# Patient Record
Sex: Male | Born: 1945 | Race: Black or African American | Hispanic: No | Marital: Married | Smoking: Former smoker
Health system: Southern US, Community
[De-identification: ages and names within clinical notes are randomized; demographics above are authoritative.]

## PROBLEM LIST (undated history)

## (undated) DIAGNOSIS — I10 Essential (primary) hypertension: Secondary | ICD-10-CM

## (undated) DIAGNOSIS — E0590 Thyrotoxicosis, unspecified without thyrotoxic crisis or storm: Secondary | ICD-10-CM

## (undated) DIAGNOSIS — E039 Hypothyroidism, unspecified: Secondary | ICD-10-CM

## (undated) HISTORY — DX: Hypothyroidism, unspecified: E03.9

## (undated) HISTORY — DX: Thyrotoxicosis, unspecified without thyrotoxic crisis or storm: E05.90

---

## 2011-06-27 ENCOUNTER — Other Ambulatory Visit (HOSPITAL_COMMUNITY): Payer: Self-pay | Admitting: "Endocrinology

## 2011-06-27 DIAGNOSIS — E0590 Thyrotoxicosis, unspecified without thyrotoxic crisis or storm: Secondary | ICD-10-CM

## 2011-07-01 ENCOUNTER — Encounter (HOSPITAL_COMMUNITY): Payer: Self-pay

## 2011-07-01 ENCOUNTER — Encounter (HOSPITAL_COMMUNITY)
Admission: RE | Admit: 2011-07-01 | Discharge: 2011-07-01 | Disposition: A | Discharge status: Home / Self Care | Payer: BC Managed Care – PPO | Source: Ambulatory Visit | Attending: "Endocrinology | Admitting: "Endocrinology

## 2011-07-01 DIAGNOSIS — E0590 Thyrotoxicosis, unspecified without thyrotoxic crisis or storm: Secondary | ICD-10-CM | POA: Insufficient documentation

## 2011-07-01 DIAGNOSIS — R634 Abnormal weight loss: Secondary | ICD-10-CM | POA: Insufficient documentation

## 2011-07-01 HISTORY — DX: Essential (primary) hypertension: I10

## 2011-07-01 MED ORDER — SODIUM IODIDE I 131 CAPSULE
9.0000 | Freq: Once | INTRAVENOUS | Status: AC | PRN
  Administered 2011-07-01: 9 via ORAL

## 2011-07-02 ENCOUNTER — Encounter (HOSPITAL_COMMUNITY)
Admission: RE | Admit: 2011-07-02 | Discharge: 2011-07-02 | Disposition: A | Discharge status: Home / Self Care | Payer: BC Managed Care – PPO | Source: Ambulatory Visit | Attending: "Endocrinology | Admitting: "Endocrinology

## 2011-07-02 MED ORDER — SODIUM PERTECHNETATE TC 99M INJECTION
10.0000 | Freq: Once | INTRAVENOUS | Status: AC | PRN
  Administered 2011-07-02: 11 via INTRAVENOUS

## 2011-07-04 ENCOUNTER — Other Ambulatory Visit (HOSPITAL_COMMUNITY): Payer: Self-pay | Admitting: "Endocrinology

## 2011-07-04 DIAGNOSIS — E0500 Thyrotoxicosis with diffuse goiter without thyrotoxic crisis or storm: Secondary | ICD-10-CM

## 2011-07-05 ENCOUNTER — Encounter (HOSPITAL_COMMUNITY)
Admission: RE | Admit: 2011-07-05 | Discharge: 2011-07-05 | Disposition: A | Discharge status: Home / Self Care | Payer: BC Managed Care – PPO | Source: Ambulatory Visit | Attending: "Endocrinology | Admitting: "Endocrinology

## 2011-07-05 DIAGNOSIS — E0500 Thyrotoxicosis with diffuse goiter without thyrotoxic crisis or storm: Secondary | ICD-10-CM

## 2011-07-05 DIAGNOSIS — E0590 Thyrotoxicosis, unspecified without thyrotoxic crisis or storm: Secondary | ICD-10-CM | POA: Insufficient documentation

## 2011-07-05 IMAGING — NM NM RAI THERAPY FOR HYPERTHYROIDISM
1 series · 1 of 1 positions shown · non-contrast
Comparison: none

CLINICAL DATA: Hyperthyroidism

RADIOACTIVE IODINE THERAPY FOR HYPERTHYROIDISM:
TECHNIQUE: The risks and benefits of radioactive iodine therapy
were discussed with the patient in detail.  Alternative therapies
were also mentioned.  Radiation safety was discussed with the
patient, including how to protect the general public from exposure.
There were no barriers to communication.  Written consent was
obtained.  The patient then received a capsule containing the
radiopharmaceutical.
The patient will follow-up with the referring physician.
Radiopharmaceutical:  12 mCi [3O] sodium iodide.

[bone scan · 2.33mm/px · 1 of 1 slices shown]
[im 1/1]
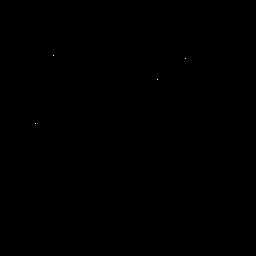

[1 of 1 positions shown; findings below may reference images not displayed]

IMPRESSION: Per oral administration of [3O] sodium iodide for the treatment of
hyperthyroidism.

## 2011-07-05 MED ORDER — SODIUM IODIDE I 131 CAPSULE
12.0000 | Freq: Once | INTRAVENOUS | Status: AC | PRN
  Administered 2011-07-05: 12 via ORAL

## 2014-12-13 ENCOUNTER — Other Ambulatory Visit: Payer: Self-pay | Admitting: "Endocrinology

## 2014-12-19 ENCOUNTER — Other Ambulatory Visit: Payer: Self-pay | Admitting: "Endocrinology

## 2014-12-19 DIAGNOSIS — E1169 Type 2 diabetes mellitus with other specified complication: Secondary | ICD-10-CM

## 2014-12-19 DIAGNOSIS — E039 Hypothyroidism, unspecified: Secondary | ICD-10-CM

## 2014-12-19 DIAGNOSIS — IMO0002 Reserved for concepts with insufficient information to code with codable children: Secondary | ICD-10-CM

## 2014-12-19 DIAGNOSIS — E1165 Type 2 diabetes mellitus with hyperglycemia: Secondary | ICD-10-CM

## 2014-12-19 LAB — HEMOGLOBIN A1C: Hemoglobin A1C: 8.1

## 2015-01-11 ENCOUNTER — Ambulatory Visit: Payer: BLUE CROSS/BLUE SHIELD | Admitting: "Endocrinology

## 2015-01-11 ENCOUNTER — Telehealth: Payer: Self-pay

## 2015-01-11 ENCOUNTER — Other Ambulatory Visit: Payer: Self-pay | Admitting: "Endocrinology

## 2015-01-11 LAB — TSH: TSH: 2.113 u[IU]/mL (ref 0.350–4.500)

## 2015-01-11 LAB — COMPREHENSIVE METABOLIC PANEL
ALT: 15 U/L (ref 9–46)
AST: 16 U/L (ref 10–35)
Albumin: 4.7 g/dL (ref 3.6–5.1)
Alkaline Phosphatase: 53 U/L (ref 40–115)
BUN: 13 mg/dL (ref 7–25)
CO2: 26 mmol/L (ref 20–31)
Calcium: 9.9 mg/dL (ref 8.6–10.3)
Chloride: 98 mmol/L (ref 98–110)
Creat: 0.81 mg/dL (ref 0.70–1.25)
Glucose, Bld: 128 mg/dL — ABNORMAL HIGH (ref 65–99)
Potassium: 4 mmol/L (ref 3.5–5.3)
Sodium: 134 mmol/L — ABNORMAL LOW (ref 135–146)
Total Bilirubin: 0.7 mg/dL (ref 0.2–1.2)
Total Protein: 7.9 g/dL (ref 6.1–8.1)

## 2015-01-11 LAB — T4, FREE: Free T4: 0.99 ng/dL (ref 0.80–1.80)

## 2015-01-11 MED ORDER — LEVOTHYROXINE SODIUM 88 MCG PO TABS: 88.0000 ug | ORAL_TABLET | Freq: Every day | ORAL | Status: DC

## 2015-01-11 NOTE — Telephone Encounter (Signed)
I sent a rx for Levothyroxine for 30 days , need to see his repeat labs to get more refills.

## 2015-01-11 NOTE — Telephone Encounter (Signed)
Pt is doing the rest of his labs today and coming back next week. He is requesting a refill on his Levothyroxine .

## 2015-01-11 NOTE — Telephone Encounter (Signed)
Pt.notified

## 2015-01-20 ENCOUNTER — Encounter: Payer: Self-pay | Admitting: "Endocrinology

## 2015-01-20 ENCOUNTER — Ambulatory Visit (INDEPENDENT_AMBULATORY_CARE_PROVIDER_SITE_OTHER): Payer: BLUE CROSS/BLUE SHIELD | Admitting: "Endocrinology

## 2015-01-20 VITALS — BP 141/82 | HR 63 | Ht 71.0 in | Wt 221.0 lb

## 2015-01-20 DIAGNOSIS — E119 Type 2 diabetes mellitus without complications: Secondary | ICD-10-CM | POA: Diagnosis not present

## 2015-01-20 DIAGNOSIS — E032 Hypothyroidism due to medicaments and other exogenous substances: Secondary | ICD-10-CM

## 2015-01-20 DIAGNOSIS — E890 Postprocedural hypothyroidism: Secondary | ICD-10-CM | POA: Insufficient documentation

## 2015-01-20 MED ORDER — LEVOTHYROXINE SODIUM 100 MCG PO TABS: 100.0000 ug | ORAL_TABLET | Freq: Every day | ORAL | Status: DC

## 2015-01-20 NOTE — Progress Notes (Signed)
Subjective:    Patient ID: Jacob Lowe, male    DOB: March 20, 1945, PCP Alinda DeemJANNACH,STEPHEN, MD   Past Medical History  Diagnosis Date  . Hypertension   . Diabetes mellitus   . Hypothyroidism   . Hyperthyroidism   . Hypertension    History reviewed. No pertinent past surgical history. Social History   Social History  . Marital Status: Married    Spouse Name: N/A  . Number of Children: N/A  . Years of Education: N/A   Social History Main Topics  . Smoking status: Former Games developermoker  . Smokeless tobacco: None  . Alcohol Use: No  . Drug Use: No  . Sexual Activity: Not Asked   Other Topics Concern  . None   Social History Narrative   Outpatient Encounter Prescriptions as of 01/20/2015  Medication Sig  . metFORMIN (GLUCOPHAGE) 500 MG tablet Take 500 mg by mouth 2 (two) times daily with a meal.  . metoprolol (LOPRESSOR) 50 MG tablet Take 50 mg by mouth 2 (two) times daily.  . verapamil (CALAN-SR) 180 MG CR tablet Take 180 mg by mouth at bedtime.  Marland Kitchen. levothyroxine (SYNTHROID, LEVOTHROID) 100 MCG tablet Take 1 tablet (100 mcg total) by mouth daily before breakfast.  . [DISCONTINUED] levothyroxine (SYNTHROID, LEVOTHROID) 88 MCG tablet Take 1 tablet (88 mcg total) by mouth daily before breakfast.   No facility-administered encounter medications on file as of 01/20/2015.   ALLERGIES: No Known Allergies VACCINATION STATUS:  There is no immunization history on file for this patient.  HPI 70 yr old Pt with GD s/p RAI ablation on 07/05/11.  He missed his appointment for more than a year. He returns for follow-up with labs. He reports that he had enough medications, did not interrupt his thyroid hormone. he feels much better, symptoms of heat intolerance and palpitations have resolved.  He has gained 6 pounds, he has steady energy level , sleeps better. he has  type 2 DM with a1c of  8.1% increasing from 6.4% last year, on metformin 500 mg by mouth twice a day. He is following with his  primary medical doctor.   Review of Systems  Constitutional: + weight gain, no fatigue, no subjective hyperthermia/hypothermia Eyes: no blurry vision, no xerophthalmia ENT: no sore throat, no nodules palpated in throat, no dysphagia/odynophagia, no hoarseness Cardiovascular: no CP/SOB/palpitations/leg swelling Respiratory: no cough/SOB Gastrointestinal: no N/V/D/C Musculoskeletal: no muscle/joint aches Skin: no rashes Neurological: no tremors/numbness/tingling/dizziness Psychiatric: no depression/anxiety  Objective:    BP 141/82 mmHg  Pulse 63  Ht 5\' 11"  (1.803 m)  Wt 221 lb (100.245 kg)  BMI 30.84 kg/m2  SpO2 97%  Wt Readings from Last 3 Encounters:  01/20/15 221 lb (100.245 kg)    Physical Exam Constitutional: overweight, in NAD Eyes: PERRLA, EOMI, no exophthalmos ENT: moist mucous membranes, no thyromegaly, no cervical lymphadenopathy Cardiovascular: RRR, No MRG Respiratory: CTA B Gastrointestinal: abdomen soft, NT, ND, BS+ Musculoskeletal: no deformities, strength intact in all 4 Skin: moist, warm, no rashes Neurological: no tremor with outstretched hands, DTR normal in all 4  Recent Results (from the past 2160 hour(s))  Comprehensive metabolic panel     Status: Abnormal   Collection Time: 01/11/15 12:01 PM  Result Value Ref Range   Sodium 134 (L) 135 - 146 mmol/L   Potassium 4.0 3.5 - 5.3 mmol/L   Chloride 98 98 - 110 mmol/L   CO2 26 20 - 31 mmol/L   Glucose, Bld 128 (H) 65 - 99 mg/dL   BUN  13 7 - 25 mg/dL   Creat 8.65 7.84 - 6.96 mg/dL   Total Bilirubin 0.7 0.2 - 1.2 mg/dL   Alkaline Phosphatase 53 40 - 115 U/L   AST 16 10 - 35 U/L   ALT 15 9 - 46 U/L   Total Protein 7.9 6.1 - 8.1 g/dL   Albumin 4.7 3.6 - 5.1 g/dL   Calcium 9.9 8.6 - 29.5 mg/dL  TSH     Status: None   Collection Time: 01/11/15 12:01 PM  Result Value Ref Range   TSH 2.113 0.350 - 4.500 uIU/mL  T4, free     Status: None   Collection Time: 01/11/15 12:01 PM  Result Value Ref Range    Free T4 0.99 0.80 - 1.80 ng/dL   Recent M8U was 1.3%.  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 -I will prescribe levothyroxine 100 g by mouth every morning.  - We discussed about correct intake of levothyroxine, at fasting, with water, separated by at least 30 minutes from breakfast, and separated by more than 4 hours from calcium, iron, multivitamins, acid reflux medications (PPIs). -Patient is made aware of the fact that thyroid hormone replacement is needed for life, dose to be adjusted by periodic monitoring of thyroid function tests.   2. Diabetes mellitus without complication (HCC)- uncontrolled with A1c of 8.1%, increased from 6.4%.  -He states that he is following with his primary care doctor for this. He is on MTF 500 mg by mouth twice a day .  I advised him to continue metformin 500 mg by mouth twice a day. He may need repeat labs in  3 months including A1c. Please send back if A1c remains above 8%. Exercise and carbohydrate regimens recommended.   - I advised patient to maintain close follow up with Alinda Deem, MD for primary care needs. Follow up plan: Return in about 1 year (around 01/20/2016) for underactive thyroid, diabetes.  Marquis Lunch, MD Phone: [phone]  Fax: [phone]   01/20/2015, 3:06 PM

## 2015-02-07 ENCOUNTER — Other Ambulatory Visit: Payer: Self-pay

## 2015-02-07 ENCOUNTER — Other Ambulatory Visit: Payer: Self-pay | Admitting: "Endocrinology

## 2015-02-07 MED ORDER — LEVOTHYROXINE SODIUM 100 MCG PO TABS: 100.0000 ug | ORAL_TABLET | Freq: Every day | ORAL | Status: DC

## 2015-07-08 DEATH — deceased

## 2016-01-19 ENCOUNTER — Other Ambulatory Visit: Payer: Self-pay | Admitting: "Endocrinology

## 2016-01-19 LAB — BASIC METABOLIC PANEL
BUN: 15 mg/dL (ref 7–25)
CO2: 27 mmol/L (ref 20–31)
Calcium: 9.8 mg/dL (ref 8.6–10.3)
Chloride: 104 mmol/L (ref 98–110)
Creat: 0.82 mg/dL (ref 0.70–1.25)
Glucose, Bld: 148 mg/dL — ABNORMAL HIGH (ref 65–99)
Potassium: 4.1 mmol/L (ref 3.5–5.3)
Sodium: 139 mmol/L (ref 135–146)

## 2016-01-19 LAB — HEMOGLOBIN A1C
Hgb A1c MFr Bld: 8.2 % — ABNORMAL HIGH (ref <5.7)
Mean Plasma Glucose: 189 mg/dL

## 2016-01-19 LAB — TSH: TSH: 0.28 mIU/L — ABNORMAL LOW (ref 0.40–4.50)

## 2016-01-19 LAB — T4, FREE: Free T4: 1.5 ng/dL (ref 0.8–1.8)

## 2016-01-25 ENCOUNTER — Ambulatory Visit: Payer: BLUE CROSS/BLUE SHIELD | Admitting: "Endocrinology

## 2016-02-12 ENCOUNTER — Ambulatory Visit (INDEPENDENT_AMBULATORY_CARE_PROVIDER_SITE_OTHER): Payer: BLUE CROSS/BLUE SHIELD | Admitting: "Endocrinology

## 2016-02-12 ENCOUNTER — Encounter: Payer: Self-pay | Admitting: "Endocrinology

## 2016-02-12 VITALS — BP 148/74 | HR 81 | Ht 71.0 in | Wt 225.0 lb

## 2016-02-12 DIAGNOSIS — E119 Type 2 diabetes mellitus without complications: Secondary | ICD-10-CM | POA: Diagnosis not present

## 2016-02-12 DIAGNOSIS — E890 Postprocedural hypothyroidism: Secondary | ICD-10-CM

## 2016-02-12 NOTE — Progress Notes (Signed)
Subjective:    Patient ID: Jacob Lowe, male    DOB: 07-22-1945, PCP Alinda DeemStephen Jannach, MD   Past Medical History:  Diagnosis Date  . Diabetes mellitus   . Hypertension   . Hypertension   . Hyperthyroidism    Resolved  after therapy with I-131  . Hypothyroidism    History reviewed. No pertinent surgical history. Social History   Social History  . Marital status: Married    Spouse name: N/A  . Number of children: N/A  . Years of education: N/A   Social History Main Topics  . Smoking status: Former Games developermoker  . Smokeless tobacco: Never Used  . Alcohol use No  . Drug use: No  . Sexual activity: Not Asked   Other Topics Concern  . None   Social History Narrative  . None   Outpatient Encounter Prescriptions as of 02/12/2016  Medication Sig  . losartan-hydrochlorothiazide (HYZAAR) 100-25 MG tablet Take 1 tablet by mouth daily.  . Vitamin D, Ergocalciferol, (DRISDOL) 50000 units CAPS capsule Take 50,000 Units by mouth every 7 (seven) days.  Marland Kitchen. levothyroxine (SYNTHROID, LEVOTHROID) 100 MCG tablet Take 1 tablet (100 mcg total) by mouth daily before breakfast.  . metFORMIN (GLUCOPHAGE) 500 MG tablet Take 500 mg by mouth 2 (two) times daily with a meal.  . metoprolol (LOPRESSOR) 50 MG tablet Take 50 mg by mouth 2 (two) times daily.  . verapamil (CALAN-SR) 180 MG CR tablet Take 180 mg by mouth at bedtime.   No facility-administered encounter medications on file as of 02/12/2016.    ALLERGIES: No Known Allergies VACCINATION STATUS:  There is no immunization history on file for this patient.  HPI 71 yr old Pt with GD s/p RAI ablation on 07/05/11.  He missed his appointment for more than a year. He returns for follow-up with labs. He reports that he had enough medications, did not interrupt his thyroid hormone. he feels much better, symptoms of heat intolerance and palpitations have resolved.  He has gained for more pounds, he has steady energy level , sleeps better. he has  type  2 DM with a1c of  8.2% increasing from 6.4% last year, on metformin 500 mg by mouth twice a day. He is following with his primary medical doctor.   Review of Systems  Constitutional: + weight gain, no fatigue, no subjective hyperthermia/hypothermia Eyes: no blurry vision, no xerophthalmia ENT: no sore throat, no nodules palpated in throat, no dysphagia/odynophagia, no hoarseness Cardiovascular: no CP/SOB/palpitations/leg swelling Respiratory: no cough/SOB Gastrointestinal: no N/V/D/C Musculoskeletal: no muscle/joint aches Skin: no rashes Neurological: no tremors/numbness/tingling/dizziness Psychiatric: no depression/anxiety  Objective:    BP (!) 148/74   Pulse 81   Ht 5\' 11"  (1.803 m)   Wt 225 lb (102.1 kg)   BMI 31.38 kg/m   Wt Readings from Last 3 Encounters:  02/12/16 225 lb (102.1 kg)  01/20/15 221 lb (100.2 kg)    Physical Exam Constitutional: overweight, in NAD Eyes: PERRLA, EOMI, no exophthalmos ENT: moist mucous membranes, no thyromegaly, no cervical lymphadenopathy Cardiovascular: RRR, No MRG Respiratory: CTA B Gastrointestinal: abdomen soft, NT, ND, BS+ Musculoskeletal: no deformities, strength intact in all 4 Skin: moist, warm, no rashes Neurological: no tremor with outstretched hands, DTR normal in all 4  Recent Results (from the past 2160 hour(s))  TSH     Status: Abnormal   Collection Time: 01/19/16 12:10 PM  Result Value Ref Range   TSH 0.28 (L) 0.40 - 4.50 mIU/L  T4, free  Status: None   Collection Time: 01/19/16 12:10 PM  Result Value Ref Range   Free T4 1.5 0.8 - 1.8 ng/dL  Hemoglobin W0J     Status: Abnormal   Collection Time: 01/19/16 12:10 PM  Result Value Ref Range   Hgb A1c MFr Bld 8.2 (H) <5.7 %    Comment:   For someone without known diabetes, a hemoglobin A1c value of 6.5% or greater indicates that they may have diabetes and this should be confirmed with a follow-up test.   For someone with known diabetes, a value <7% indicates  that their diabetes is well controlled and a value greater than or equal to 7% indicates suboptimal control. A1c targets should be individualized based on duration of diabetes, age, comorbid conditions, and other considerations.   Currently, no consensus exists for use of hemoglobin A1c for diagnosis of diabetes for children.      Mean Plasma Glucose 189 mg/dL  Basic metabolic panel     Status: Abnormal   Collection Time: 01/19/16 12:13 PM  Result Value Ref Range   Sodium 139 135 - 146 mmol/L   Potassium 4.1 3.5 - 5.3 mmol/L   Chloride 104 98 - 110 mmol/L   CO2 27 20 - 31 mmol/L   Glucose, Bld 148 (H) 65 - 99 mg/dL   BUN 15 7 - 25 mg/dL   Creat 8.11 9.14 - 7.82 mg/dL    Comment:   For patients > or = 69 years of age: The upper reference limit for Creatinine is approximately 13% higher for people identified as African-American.      Calcium 9.8 8.6 - 10.3 mg/dL   Recent N5A was 2.1%.  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 -I will prescribe levothyroxine 100 g by mouth every morning.  - We discussed about correct intake of levothyroxine, at fasting, with water, separated by at least 30 minutes from breakfast, and separated by more than 4 hours from calcium, iron, multivitamins, acid reflux medications (PPIs). -Patient is made aware of the fact that thyroid hormone replacement is needed for life, dose to be adjusted by periodic monitoring of thyroid function tests.   2. Diabetes mellitus without complication (HCC)- uncontrolled with A1c of 8.2%,  Progressively increased from 6.4%.  -He states that he is following with his primary care doctor for this. He is on MTF 500 mg by mouth twice a day .  I advised him to increase  Metformin to 1000 mg by mouth twice a day. He may need repeat labs in  3 months including A1c. Please send back if A1c remains above  8%. Exercise and carbohydrate regimens recommended.   - I advised patient to maintain close follow up with Alinda Deem, MD for primary care needs. Follow up plan: Return in about 1 year (around 02/11/2017) for follow up with pre-visit labs.  Marquis Lunch, MD Phone: [phone]  Fax: [phone]   02/12/2016, 3:28 PM

## 2016-03-23 ENCOUNTER — Other Ambulatory Visit: Payer: Self-pay | Admitting: "Endocrinology

## 2016-09-03 ENCOUNTER — Encounter (INDEPENDENT_AMBULATORY_CARE_PROVIDER_SITE_OTHER): Payer: Self-pay | Admitting: *Deleted

## 2016-09-14 ENCOUNTER — Other Ambulatory Visit: Payer: Self-pay | Admitting: "Endocrinology

## 2017-02-03 ENCOUNTER — Other Ambulatory Visit: Payer: Self-pay | Admitting: "Endocrinology

## 2017-02-03 DIAGNOSIS — E039 Hypothyroidism, unspecified: Secondary | ICD-10-CM

## 2017-02-03 DIAGNOSIS — E1165 Type 2 diabetes mellitus with hyperglycemia: Secondary | ICD-10-CM

## 2017-02-05 ENCOUNTER — Encounter (INDEPENDENT_AMBULATORY_CARE_PROVIDER_SITE_OTHER): Payer: Self-pay | Admitting: *Deleted

## 2017-02-05 ENCOUNTER — Other Ambulatory Visit (INDEPENDENT_AMBULATORY_CARE_PROVIDER_SITE_OTHER): Payer: Self-pay | Admitting: *Deleted

## 2017-02-05 DIAGNOSIS — Z86010 Personal history of colonic polyps: Secondary | ICD-10-CM

## 2017-02-06 LAB — COMPREHENSIVE METABOLIC PANEL
AG Ratio: 1.4 (calc) (ref 1.0–2.5)
ALT: 12 U/L (ref 9–46)
AST: 13 U/L (ref 10–35)
Albumin: 4.8 g/dL (ref 3.6–5.1)
Alkaline phosphatase (APISO): 67 U/L (ref 40–115)
BUN: 14 mg/dL (ref 7–25)
CO2: 28 mmol/L (ref 20–32)
Calcium: 10.3 mg/dL (ref 8.6–10.3)
Chloride: 101 mmol/L (ref 98–110)
Creat: 0.88 mg/dL (ref 0.70–1.25)
Globulin: 3.4 g/dL (calc) (ref 1.9–3.7)
Glucose, Bld: 102 mg/dL (ref 65–139)
Potassium: 4.5 mmol/L (ref 3.5–5.3)
Sodium: 140 mmol/L (ref 135–146)
Total Bilirubin: 0.5 mg/dL (ref 0.2–1.2)
Total Protein: 8.2 g/dL — ABNORMAL HIGH (ref 6.1–8.1)

## 2017-02-06 LAB — HEMOGLOBIN A1C
Hgb A1c MFr Bld: 7.3 % of total Hgb — ABNORMAL HIGH (ref <5.7)
Mean Plasma Glucose: 163 (calc)
eAG (mmol/L): 9 (calc)

## 2017-02-06 LAB — T4, FREE: Free T4: 1.5 ng/dL (ref 0.8–1.8)

## 2017-02-06 LAB — TSH: TSH: 0.29 mIU/L — ABNORMAL LOW (ref 0.40–4.50)

## 2017-02-12 ENCOUNTER — Ambulatory Visit (INDEPENDENT_AMBULATORY_CARE_PROVIDER_SITE_OTHER): Payer: BLUE CROSS/BLUE SHIELD | Admitting: "Endocrinology

## 2017-02-12 ENCOUNTER — Encounter: Payer: Self-pay | Admitting: "Endocrinology

## 2017-02-12 ENCOUNTER — Telehealth: Payer: Self-pay

## 2017-02-12 ENCOUNTER — Other Ambulatory Visit: Payer: Self-pay

## 2017-02-12 ENCOUNTER — Emergency Department (HOSPITAL_COMMUNITY)
Admission: EM | Admit: 2017-02-12 | Discharge: 2017-02-12 | Disposition: A | Discharge status: Home / Self Care | Payer: BLUE CROSS/BLUE SHIELD | Attending: Emergency Medicine | Admitting: Emergency Medicine

## 2017-02-12 ENCOUNTER — Encounter (HOSPITAL_COMMUNITY): Payer: Self-pay | Admitting: Emergency Medicine

## 2017-02-12 VITALS — BP 203/88 | HR 64 | Ht 71.0 in | Wt 210.0 lb

## 2017-02-12 DIAGNOSIS — Z7982 Long term (current) use of aspirin: Secondary | ICD-10-CM | POA: Diagnosis not present

## 2017-02-12 DIAGNOSIS — E890 Postprocedural hypothyroidism: Secondary | ICD-10-CM | POA: Diagnosis not present

## 2017-02-12 DIAGNOSIS — E119 Type 2 diabetes mellitus without complications: Secondary | ICD-10-CM

## 2017-02-12 DIAGNOSIS — Z7984 Long term (current) use of oral hypoglycemic drugs: Secondary | ICD-10-CM | POA: Diagnosis not present

## 2017-02-12 DIAGNOSIS — I10 Essential (primary) hypertension: Secondary | ICD-10-CM | POA: Insufficient documentation

## 2017-02-12 DIAGNOSIS — Z79899 Other long term (current) drug therapy: Secondary | ICD-10-CM | POA: Insufficient documentation

## 2017-02-12 DIAGNOSIS — E039 Hypothyroidism, unspecified: Secondary | ICD-10-CM | POA: Diagnosis not present

## 2017-02-12 DIAGNOSIS — Z87891 Personal history of nicotine dependence: Secondary | ICD-10-CM | POA: Insufficient documentation

## 2017-02-12 NOTE — Discharge Instructions (Signed)
Return if any problems.

## 2017-02-12 NOTE — ED Provider Notes (Signed)
[facility]   CSN: [id number] Arrival date & time: 02/12/17  1422     History   Chief Complaint Chief Complaint  Patient presents with  . Hypertension    HPI Jenel LucksDavid Lowe is a 72 y.o. male.  The history is provided by the patient. No language interpreter was used.  Hypertension  This is a new problem. The current episode started 3 to 5 hours ago. The problem occurs constantly. The problem has been gradually improving. Associated symptoms include headaches. Pertinent negatives include no chest pain. Nothing aggravates the symptoms. Nothing relieves the symptoms. He has tried nothing for the symptoms. The treatment provided no relief.  Pt was seen at his endrocinologist office today for his diabetes and thyroid.  Pt was told to come to ED because of high blood pressure.  Pt reports blood pressure has come down since he was at his doctors office. Pt reports BP is always up when he sees his doctor.  Pt's blood pressure was 203/88 at his doctors office.  Pt is not having any symptoms,  No chest pain, no shortness of breath.   Past Medical History:  Diagnosis Date  . Diabetes mellitus   . Hypertension   . Hypertension   . Hyperthyroidism    Resolved  after therapy with I-131  . Hypothyroidism     Patient Active Problem List   Diagnosis Date Noted  . Essential hypertension, benign 02/12/2017  . History of colonic polyps 02/05/2017  . Hypothyroidism following radioiodine therapy 01/20/2015  . Diabetes mellitus without complication (HCC) 01/20/2015    History reviewed. No pertinent surgical history.     Home Medications    Prior to Admission medications   Medication Sig Start Date End Date Taking? Authorizing Provider  aspirin 325 MG tablet Take 650 mg by mouth once.   Yes [provider]  levothyroxine (SYNTHROID, LEVOTHROID) 100 MCG tablet TAKE 1 TABLET BY MOUTH EVERY DAY BEFORE BREAKFAST 09/16/16  Yes Nida, Denman GeorgeGebreselassie  W, MD  losartan-hydrochlorothiazide (HYZAAR) 100-25 MG tablet Take 1 tablet by mouth daily.   Yes [provider]  metFORMIN (GLUCOPHAGE) 500 MG tablet Take 500 mg by mouth 2 (two) times daily with a meal.   Yes [provider]  metoprolol (LOPRESSOR) 50 MG tablet Take 50 mg by mouth 2 (two) times daily.   Yes [provider]  verapamil (CALAN-SR) 180 MG CR tablet Take 180 mg by mouth at bedtime.   Yes [provider]  Vitamin D, Ergocalciferol, (DRISDOL) 50000 units CAPS capsule Take 50,000 Units by mouth every 7 (seven) days.   Yes [provider]    Family History Family History  Problem Relation Age of Onset  . Hypertension Mother     Social History Social History   Tobacco Use  . Smoking status: Former Games developermoker  . Smokeless tobacco: Never Used  Substance Use Topics  . Alcohol use: No    Alcohol/week: 0.0 oz  . Drug use: No     Allergies   Patient has no known allergies.   Review of Systems Review of Systems  Cardiovascular: Negative for chest pain.  Neurological: Positive for headaches.  All other systems reviewed and are negative.    Physical Exam Updated Vital Signs BP (!) 170/62   Pulse (!) 59   Temp 98.4 F (36.9 C) (Oral)   Resp 18   Ht 5\' 11"  (1.803 m)   Wt 95.3 kg (210 lb)   SpO2 99%  BMI 29.29 kg/m   Physical Exam  Constitutional: He appears well-developed and well-nourished.  HENT:  Head: Normocephalic and atraumatic.  Eyes: Conjunctivae are normal.  Neck: Neck supple.  Cardiovascular: Normal rate and regular rhythm.  No murmur heard. Pulmonary/Chest: Effort normal and breath sounds normal. No respiratory distress.  Abdominal: Soft. There is no tenderness.  Musculoskeletal: He exhibits no edema.  Neurological: He is alert.  Skin: Skin is warm and dry.  Psychiatric: He has a normal mood and affect.  Nursing note and vitals reviewed.    ED Treatments / Results  Labs (all labs ordered are  listed, but only abnormal results are displayed) Labs Reviewed - No data to display  EKG  EKG Interpretation None       Radiology No results found.  Procedures Procedures (including critical care time)  Medications Ordered in ED Medications - No data to display   Initial Impression / Assessment and Plan / ED Course  I have reviewed the triage vital signs and the nursing notes.  Pertinent labs & imaging results that were available during my care of the patient were reviewed by me and considered in my medical decision making (see chart for details).     ED course.   No symptoms of hypertensive crisis.  Pt advised to start  taking his BP at home and keep a journal for his MD.  Pt is advised to see his MD for recheck.  Final Clinical Impressions(s) / ED Diagnoses   Final diagnoses:  Hypertension, unspecified type    ED Discharge Orders    None    An After Visit Summary was printed and given to the patient.   Elson Areas, PA-C 02/12/17 1740    Samuel Jester, DO 02/15/17 1651

## 2017-02-12 NOTE — Progress Notes (Signed)
Subjective:    Patient ID: Jacob Lowe, male    DOB: February 14, 1945, PCP Roma Kayser, MD   Past Medical History:  Diagnosis Date  . Diabetes mellitus   . Hypertension   . Hypertension   . Hyperthyroidism    Resolved  after therapy with I-131  . Hypothyroidism    History reviewed. No pertinent surgical history. Social History   Socioeconomic History  . Marital status: Married    Spouse name: None  . Number of children: None  . Years of education: None  . Highest education level: None  Social Needs  . Financial resource strain: None  . Food insecurity - worry: None  . Food insecurity - inability: None  . Transportation needs - medical: None  . Transportation needs - non-medical: None  Occupational History  . None  Tobacco Use  . Smoking status: Former Games developer  . Smokeless tobacco: Never Used  Substance and Sexual Activity  . Alcohol use: No    Alcohol/week: 0.0 oz  . Drug use: No  . Sexual activity: None  Other Topics Concern  . None  Social History Narrative  . None   Outpatient Encounter Medications as of 02/12/2017  Medication Sig  . levothyroxine (SYNTHROID, LEVOTHROID) 100 MCG tablet TAKE 1 TABLET BY MOUTH EVERY DAY BEFORE BREAKFAST  . losartan-hydrochlorothiazide (HYZAAR) 100-25 MG tablet Take 1 tablet by mouth daily.  . metFORMIN (GLUCOPHAGE) 500 MG tablet Take 500 mg by mouth 2 (two) times daily with a meal.  . metoprolol (LOPRESSOR) 50 MG tablet Take 50 mg by mouth 2 (two) times daily.  . verapamil (CALAN-SR) 180 MG CR tablet Take 180 mg by mouth at bedtime.  . Vitamin D, Ergocalciferol, (DRISDOL) 50000 units CAPS capsule Take 50,000 Units by mouth every 7 (seven) days.  . [DISCONTINUED] levothyroxine (SYNTHROID, LEVOTHROID) 100 MCG tablet Take 1 tablet (100 mcg total) by mouth daily before breakfast.   No facility-administered encounter medications on file as of 02/12/2017.    ALLERGIES: No Known Allergies VACCINATION STATUS:  There is no  immunization history on file for this patient.  HPI 72 yr old patient with GD status post RAI ablation on 07/05/11.  He returns for follow-up with labs. -He was left on 100 mcg of levothyroxine.  He reports compliance. -He has lost 10 pounds since last visit. He sleeps better.  he has  type 2 DM with a1c of 7.3% improving from 8.2%, currently on metformin 1000 mg p.o. twice daily.   He is following with his primary medical doctor. -He has uncontrolled hypertension on 4 medications.  During this visit his blood pressure was repeated 3 times and showed 181/88, 203/88, 211/84. -Denies any headache, chest pain, shortness of breath.   Review of Systems  Constitutional: + weight loss, no fatigue, no subjective hyperthermia/hypothermia Eyes: no blurry vision, no xerophthalmia ENT: no sore throat, no nodules palpated in throat, no dysphagia/odynophagia, no hoarseness Cardiovascular: No chest pain, no palpitations, no leg swelling.    Respiratory: no cough/SOB Gastrointestinal: no N/V/D/C Musculoskeletal: no muscle/joint aches Skin: no rashes Neurological: no tremors/numbness/tingling/dizziness Psychiatric: no depression/anxiety  Objective:    BP (!) 203/88   Pulse 64   Ht 5\' 11"  (1.803 m)   Wt 210 lb (95.3 kg)   BMI 29.29 kg/m   Wt Readings from Last 3 Encounters:  02/12/17 210 lb (95.3 kg)  02/12/17 210 lb (95.3 kg)  02/12/16 225 lb (102.1 kg)    Physical Exam Constitutional: overweight, in NAD Eyes:  PERRLA, EOMI, no exophthalmos ENT: moist mucous membranes, no thyromegaly, no cervical lymphadenopathy Cardiovascular: RRR, No MRG Respiratory: CTA B Gastrointestinal: abdomen soft, NT, ND, BS+ Musculoskeletal: no deformities, strength intact in all 4 Skin: moist, warm, no rashes Neurological: No tremors of outstretched hands, DTR reflexes are normal in bilateral lower extremities.    4  Recent Results (from the past 2160 hour(s))  Comprehensive metabolic panel     Status:  Abnormal   Collection Time: 02/05/17  3:41 PM  Result Value Ref Range   Glucose, Bld 102 65 - 139 mg/dL    Comment: .        Non-fasting reference interval .    BUN 14 7 - 25 mg/dL   Creat 5.620.88 1.300.70 - 8.651.25 mg/dL    Comment: For patients >70 years of age, the reference limit for Creatinine is approximately 13% higher for people identified as African-American. .    BUN/Creatinine Ratio NOT APPLICABLE 6 - 22 (calc)   Sodium 140 135 - 146 mmol/L   Potassium 4.5 3.5 - 5.3 mmol/L   Chloride 101 98 - 110 mmol/L   CO2 28 20 - 32 mmol/L   Calcium 10.3 8.6 - 10.3 mg/dL   Total Protein 8.2 (H) 6.1 - 8.1 g/dL   Albumin 4.8 3.6 - 5.1 g/dL   Globulin 3.4 1.9 - 3.7 g/dL (calc)   AG Ratio 1.4 1.0 - 2.5 (calc)   Total Bilirubin 0.5 0.2 - 1.2 mg/dL   Alkaline phosphatase (APISO) 67 40 - 115 U/L   AST 13 10 - 35 U/L   ALT 12 9 - 46 U/L  Hemoglobin A1c     Status: Abnormal   Collection Time: 02/05/17  3:41 PM  Result Value Ref Range   Hgb A1c MFr Bld 7.3 (H) <5.7 % of total Hgb    Comment: For someone without known diabetes, a hemoglobin A1c value of 6.5% or greater indicates that they may have  diabetes and this should be confirmed with a follow-up  test. . For someone with known diabetes, a value <7% indicates  that their diabetes is well controlled and a value  greater than or equal to 7% indicates suboptimal  control. A1c targets should be individualized based on  duration of diabetes, age, comorbid conditions, and  other considerations. . Currently, no consensus exists regarding use of hemoglobin A1c for diagnosis of diabetes for children. .    Mean Plasma Glucose 163 (calc)   eAG (mmol/L) 9.0 (calc)  TSH     Status: Abnormal   Collection Time: 02/05/17  3:41 PM  Result Value Ref Range   TSH 0.29 (L) 0.40 - 4.50 mIU/L  T4, Free     Status: None   Collection Time: 02/05/17  3:41 PM  Result Value Ref Range   Free T4 1.5 0.8 - 1.8 ng/dL   Recent H8IA1c was 6.9%6.8%.  Assessment &  Plan:    1.  Hypertensive crisis: 211/84, 203/88, 181/88 -Despite 4 medications including verapamil, metoprolol, losartan/HCTZ. -No symptoms.  I have advised him to go to emergency room on [facility] for better evaluation and treatment.   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 -I advised him to continue  levothyroxine 100 g by mouth every morning.  - We discussed about correct intake of levothyroxine, at fasting, with water, separated by at least 30 minutes from breakfast, and separated by more than 4 hours from calcium, iron,  multivitamins, acid reflux medications (PPIs). -Patient is made aware of the fact that thyroid hormone replacement is needed for life, dose to be adjusted by periodic monitoring of thyroid function tests.   3. Diabetes mellitus without complication (HCC)- uncontrolled -his A1c has improved to 7.3% from 8.2%.    - I advised him to continue Metformin to 1000 mg by mouth twice a day.  -  Suggestion is made for him to avoid simple carbohydrates  from his diet including Cakes, Sweet Desserts / Pastries, Ice Cream, Soda (diet and regular), Sweet Tea, Candies, Chips, Cookies, Store Bought Juices, Alcohol in Excess of  1-2 drinks a day, Artificial Sweeteners, and "Sugar-free" Products. This will help patient to have stable blood glucose profile and potentially avoid unintended weight gain. He may need repeat labs in  3 months including A1c.   - I advised patient to maintain close follow up with PCP for primary care needs.  - Time spent with the patient: 25 min, of which >50% was spent in reviewing her  current and  previous labs, previous treatments, and medications  doses and developing a plan for long-term care.   Follow up plan: Return in about 3 months (around 05/12/2017) for follow up with pre-visit labs, [facility].  Marquis Lunch, MD Phone:  [phone]  Fax: [phone]   02/12/2017, 4:35 PM

## 2017-02-12 NOTE — Telephone Encounter (Signed)
Pt states that he wasn't sure if he was gonna stay at the ER because his BP was 172/70. He does not want to stay because the ER is full. He was requesting a call from Dr Fransico HimNida but I told him that if he made the decision to leave and not be seen that he does need to see his PCP for Hypertension.

## 2017-02-12 NOTE — ED Triage Notes (Signed)
Patient seen at MD office for follow-up for hypothyroidism, patient's BP readings high.

## 2017-02-12 NOTE — Patient Instructions (Signed)
To emergency room at [facility] for hypertensive crisis: 1) 181/88, 2) 203/88, 3) 211/84

## 2017-02-12 NOTE — Telephone Encounter (Signed)
That is the right response, thank you.

## 2017-04-14 ENCOUNTER — Other Ambulatory Visit: Payer: Self-pay | Admitting: "Endocrinology

## 2017-05-06 ENCOUNTER — Encounter (INDEPENDENT_AMBULATORY_CARE_PROVIDER_SITE_OTHER): Payer: Self-pay | Admitting: *Deleted

## 2017-05-06 ENCOUNTER — Telehealth (INDEPENDENT_AMBULATORY_CARE_PROVIDER_SITE_OTHER): Payer: Self-pay | Admitting: *Deleted

## 2017-05-06 LAB — COMPLETE METABOLIC PANEL WITH GFR
AG Ratio: 1.5 (calc) (ref 1.0–2.5)
ALT: 13 U/L (ref 9–46)
AST: 14 U/L (ref 10–35)
Albumin: 4.8 g/dL (ref 3.6–5.1)
Alkaline phosphatase (APISO): 66 U/L (ref 40–115)
BUN: 19 mg/dL (ref 7–25)
CO2: 25 mmol/L (ref 20–32)
Calcium: 10.1 mg/dL (ref 8.6–10.3)
Chloride: 103 mmol/L (ref 98–110)
Creat: 0.89 mg/dL (ref 0.70–1.18)
GFR, Est African American: 100 mL/min/{1.73_m2} (ref >=60)
GFR, Est Non African American: 87 mL/min/{1.73_m2} (ref >=60)
Globulin: 3.1 g/dL (calc) (ref 1.9–3.7)
Glucose, Bld: 120 mg/dL — ABNORMAL HIGH (ref 65–99)
Potassium: 3.9 mmol/L (ref 3.5–5.3)
Sodium: 139 mmol/L (ref 135–146)
Total Bilirubin: 0.5 mg/dL (ref 0.2–1.2)
Total Protein: 7.9 g/dL (ref 6.1–8.1)

## 2017-05-06 LAB — TSH: TSH: 0.4 mIU/L (ref 0.40–4.50)

## 2017-05-06 LAB — T4, FREE: Free T4: 1.6 ng/dL (ref 0.8–1.8)

## 2017-05-06 MED ORDER — PEG 3350-KCL-NA BICARB-NACL 420 G PO SOLR: 4000.0000 mL | Freq: Once | ORAL | 0 refills | Status: AC

## 2017-05-06 NOTE — Telephone Encounter (Signed)
Patient needs trilyte 

## 2017-05-07 LAB — HEMOGLOBIN A1C
Hgb A1c MFr Bld: 7.1 % of total Hgb — ABNORMAL HIGH (ref <5.7)
Mean Plasma Glucose: 157 (calc)
eAG (mmol/L): 8.7 (calc)

## 2017-05-12 ENCOUNTER — Telehealth (INDEPENDENT_AMBULATORY_CARE_PROVIDER_SITE_OTHER): Payer: Self-pay | Admitting: *Deleted

## 2017-05-12 NOTE — Telephone Encounter (Signed)
Referring MD/PCP: dianne elliott   Procedure: tcs  Reason/Indication:  Hx polyps  Has patient had this procedure before?  Yes, 2013 & 2008 (sanned)  If so, when, by whom and where?    Is there a family history of colon cancer?  no  Who?  What age when diagnosed?    Is patient diabetic?   yes      Does patient have prosthetic heart valve or mechanical valve?  no  Do you have a pacemaker?  no  Has patient ever had endocarditis? no  Has patient had joint replacement within last 12 months?  no  Is patient constipated or do they take laxatives? no  Does patient have a history of alcohol/drug use?  no  Is patient on blood thinner such as Coumadin, Plavix and/or Aspirin? no  Medications: verapamil 180 mg daily, metformin 500 mg bid, metoprolol 50 mg bid, losartan/hctz 100/12.5 mg daily, levothyroxine 100 mg daily, vit d2 50,000 units daily  Allergies: nkda  Medication Adjustment per Dr Keane Police, NP: asa 2 days & hold metformin evening before and morning of  Procedure date & time: 06/11/17 at 830

## 2017-05-12 NOTE — Telephone Encounter (Signed)
agree

## 2017-05-13 ENCOUNTER — Encounter: Payer: Self-pay | Admitting: "Endocrinology

## 2017-05-13 ENCOUNTER — Ambulatory Visit (INDEPENDENT_AMBULATORY_CARE_PROVIDER_SITE_OTHER): Payer: BLUE CROSS/BLUE SHIELD | Admitting: "Endocrinology

## 2017-05-13 VITALS — BP 148/81 | HR 68 | Ht 71.0 in | Wt 211.0 lb

## 2017-05-13 DIAGNOSIS — E890 Postprocedural hypothyroidism: Secondary | ICD-10-CM | POA: Diagnosis not present

## 2017-05-13 DIAGNOSIS — E119 Type 2 diabetes mellitus without complications: Secondary | ICD-10-CM | POA: Diagnosis not present

## 2017-05-13 DIAGNOSIS — I10 Essential (primary) hypertension: Secondary | ICD-10-CM | POA: Diagnosis not present

## 2017-05-13 NOTE — Progress Notes (Signed)
Subjective:    Patient ID: Jacob Lowe, male    DOB: 11-15-1945,   Past Medical History:  Diagnosis Date  . Diabetes mellitus   . Hypertension   . Hypertension   . Hyperthyroidism    Resolved  after therapy with I-131  . Hypothyroidism    History reviewed. No pertinent surgical history. Social History   Socioeconomic History  . Marital status: Married    Spouse name: Not on file  . Number of children: Not on file  . Years of education: Not on file  . Highest education level: Not on file  Occupational History  . Not on file  Social Needs  . Financial resource strain: Not on file  . Food insecurity:    Worry: Not on file    Inability: Not on file  . Transportation needs:    Medical: Not on file    Non-medical: Not on file  Tobacco Use  . Smoking status: Former Games developer  . Smokeless tobacco: Never Used  Substance and Sexual Activity  . Alcohol use: No    Alcohol/week: 0.0 oz  . Drug use: No  . Sexual activity: Not on file  Lifestyle  . Physical activity:    Days per week: Not on file    Minutes per session: Not on file  . Stress: Not on file  Relationships  . Social connections:    Talks on phone: Not on file    Gets together: Not on file    Attends religious service: Not on file    Active member of club or organization: Not on file    Attends meetings of clubs or organizations: Not on file    Relationship status: Not on file  Other Topics Concern  . Not on file  Social History Narrative  . Not on file   Outpatient Encounter Medications as of 05/13/2017  Medication Sig  . aspirin 325 MG tablet Take 650 mg by mouth once.  Marland Kitchen levothyroxine (SYNTHROID, LEVOTHROID) 100 MCG tablet TAKE 1 TABLET BY MOUTH EVERY DAY BEFORE BREAKFAST  . losartan-hydrochlorothiazide (HYZAAR) 100-25 MG tablet Take 1 tablet by mouth daily.  . metFORMIN (GLUCOPHAGE) 500 MG tablet Take 500 mg by mouth 2 (two) times daily with a meal.  . metoprolol (LOPRESSOR) 50 MG tablet Take 50 mg by  mouth 2 (two) times daily.  . verapamil (CALAN-SR) 180 MG CR tablet Take 180 mg by mouth at bedtime.  . [DISCONTINUED] Vitamin D, Ergocalciferol, (DRISDOL) 50000 units CAPS capsule Take 50,000 Units by mouth every 7 (seven) days.   No facility-administered encounter medications on file as of 05/13/2017.    ALLERGIES: No Known Allergies VACCINATION STATUS:  There is no immunization history on file for this patient.  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    He returns for follow-up with repeat thyroid function tests.  He is currently on levothyroxine 100 mcg p.o. nightly.      He reports compliance, has a steady weight.  He denies palpitations, heat intolerance.  He sleeps better. -He also has type 2 diabetes on metformin 1000 mg p.o. twice daily recent A1c of 7.1%.  -He has difficult to control hypertension on 4 medications, following with his primary medical doctor. -His clinic blood pressure ranged from 141-189/74-78 on 3 different measurements. -Denies any headache, chest pain, shortness of breath.   Review of Systems  Constitutional: + steady weight, no fatigue, no  subjective hyperthermia/hypothermia Eyes: no blurry vision, no xerophthalmia ENT: no sore throat, no nodules palpated in throat, no dysphagia/odynophagia, no hoarseness Cardiovascular: No chest pain, no palpitations, no leg swelling.    Respiratory: no cough/SOB Gastrointestinal: no N/V/D/C Musculoskeletal: no muscle/joint aches Skin: no rashes Neurological: no tremors/numbness/tingling/dizziness Psychiatric: no depression/anxiety  Objective:    BP (!) 148/81   Pulse 68   Ht  (1.803 m)   Wt 211 lb (95.7 kg)   BMI 29.43 kg/m   Wt Readings from Last 3 Encounters:  05/13/17 211 lb (95.7 kg)  02/12/17 210 lb (95.3 kg)  02/12/17 210 lb (95.3 kg)    Physical Exam Constitutional: overweight, in NAD Eyes: PERRLA, EOMI, no  exophthalmos ENT: moist mucous membranes, no thyromegaly, no cervical lymphadenopathy Musculoskeletal: no deformities, strength intact in all 4 Skin: moist, warm, no rashes Neurological: No tremors of outstretched hands   Recent Results (from the past 2160 hour(s))  T4, free     Status: None   Collection Time: 05/06/17 10:29 AM  Result Value Ref Range   Free T4 1.6 0.8 - 1.8 ng/dL  TSH     Status: None   Collection Time: 05/06/17 10:29 AM  Result Value Ref Range   TSH 0.40 0.40 - 4.50 mIU/L  COMPLETE METABOLIC PANEL WITH GFR     Status: Abnormal   Collection Time: 05/06/17 10:29 AM  Result Value Ref Range   Glucose, Bld 120 (H) 65 - 99 mg/dL    Comment: .            Fasting reference interval . For someone without known diabetes, a glucose value between 100 and 125 mg/dL is consistent with prediabetes and should be confirmed with a follow-up test. .    BUN 19 7 - 25 mg/dL   Creat 1.61 0.96 - 0.45 mg/dL    Comment: For patients >57 years of age, the reference limit for Creatinine is approximately 13% higher for people identified as African-American. .    GFR, Est Non African American 87 > OR = 60 mL/min/1.55m2   GFR, Est African American 100 > OR = 60 mL/min/1.41m2   BUN/Creatinine Ratio NOT APPLICABLE 6 - 22 (calc)   Sodium 139 135 - 146 mmol/L   Potassium 3.9 3.5 - 5.3 mmol/L   Chloride 103 98 - 110 mmol/L   CO2 25 20 - 32 mmol/L   Calcium 10.1 8.6 - 10.3 mg/dL   Total Protein 7.9 6.1 - 8.1 g/dL   Albumin 4.8 3.6 - 5.1 g/dL   Globulin 3.1 1.9 - 3.7 g/dL (calc)   AG Ratio 1.5 1.0 - 2.5 (calc)   Total Bilirubin 0.5 0.2 - 1.2 mg/dL   Alkaline phosphatase (APISO) 66 40 - 115 U/L   AST 14 10 - 35 U/L   ALT 13 9 - 46 U/L  Hemoglobin A1c     Status: Abnormal   Collection Time: 05/06/17 10:29 AM  Result Value Ref Range   Hgb A1c MFr Bld 7.1 (H) <5.7 % of total Hgb    Comment: For someone without known diabetes, a hemoglobin A1c value of 6.5% or greater indicates that  they may have  diabetes and this should be confirmed with a follow-up  test. . For someone with known diabetes, a value <7% indicates  that their diabetes is well controlled and a value  greater than or equal to 7% indicates suboptimal  control. A1c targets should be individualized based on  duration of diabetes, age, comorbid  conditions, and  other considerations. . Currently, no consensus exists regarding use of hemoglobin A1c for diagnosis of diabetes for children. .    Mean Plasma Glucose 157 (calc)   eAG (mmol/L) 8.7 (calc)   Recent A1c was 6.8%.  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 -I advised him to continue  levothyroxine 100 g by mouth every morning.  - We discussed about correct intake of levothyroxine, at fasting, with water, separated by at least 30 minutes from breakfast, and separated by more than 4 hours from calcium, iron, multivitamins, acid reflux medications (PPIs). -Patient is made aware of the fact that thyroid hormone replacement is needed for life, dose to be adjusted by periodic monitoring of thyroid function tests.  2.  Hypertension: -During his last visit he did have hypertensive crisis which required ER visit.   -His blood pressure is still high at 141-189/74 - 78.   -He has 4 different medications for blood pressure including verapamil, metoprolol, losartan, and hydrochlorthiazide.    -He states that his blood pressure measurements at home are usually normal, and gets higher readings at doctor's offices.    -No symptoms.  I will add aldosterone , renin activity with ratio before his next visit to rule out hyperaldosteronism.   -He is advised to follow-up with his PMD in 4 weeks for high blood pressure and electrocardiogram.  3. Diabetes mellitus without complication (HCC) -His labs show A1c of 7.1%,  improving from 8.2%.    - I advised him to continue  Metformin to 1000 mg by mouth twice a day.  -  Suggestion is made for him to avoid simple carbohydrates  from his diet including Cakes, Sweet Desserts / Pastries, Ice Cream, Soda (diet and regular), Sweet Tea, Candies, Chips, Cookies, Store Bought Juices, Alcohol in Excess of  1-2 drinks a day, Artificial Sweeteners, and "Sugar-free" Products. This will help patient to have stable blood glucose profile and potentially avoid unintended weight gain.  - I advised patient to maintain close follow up with PCP for primary care needs.   Follow up plan: Return in about 6 months (around 11/13/2017) for follow up with pre-visit labs.  Marquis Lunch, MD Phone: [phone]  Fax: [phone]   05/13/2017, 4:07 PM

## 2017-06-11 ENCOUNTER — Encounter (HOSPITAL_COMMUNITY): Payer: Self-pay | Admitting: *Deleted

## 2017-06-11 ENCOUNTER — Encounter (HOSPITAL_COMMUNITY)
Admission: RE | Disposition: A | Discharge status: Home / Self Care | Payer: Self-pay | Source: Ambulatory Visit | Attending: Internal Medicine

## 2017-06-11 ENCOUNTER — Other Ambulatory Visit: Payer: Self-pay

## 2017-06-11 ENCOUNTER — Ambulatory Visit (HOSPITAL_COMMUNITY)
Admission: RE | Admit: 2017-06-11 | Discharge: 2017-06-11 | Disposition: A | Discharge status: Home / Self Care | Payer: BLUE CROSS/BLUE SHIELD | Source: Ambulatory Visit | Attending: Internal Medicine | Admitting: Internal Medicine

## 2017-06-11 DIAGNOSIS — Z7982 Long term (current) use of aspirin: Secondary | ICD-10-CM | POA: Insufficient documentation

## 2017-06-11 DIAGNOSIS — Z7984 Long term (current) use of oral hypoglycemic drugs: Secondary | ICD-10-CM | POA: Insufficient documentation

## 2017-06-11 DIAGNOSIS — Z09 Encounter for follow-up examination after completed treatment for conditions other than malignant neoplasm: Secondary | ICD-10-CM | POA: Diagnosis not present

## 2017-06-11 DIAGNOSIS — K648 Other hemorrhoids: Secondary | ICD-10-CM | POA: Diagnosis not present

## 2017-06-11 DIAGNOSIS — E039 Hypothyroidism, unspecified: Secondary | ICD-10-CM | POA: Insufficient documentation

## 2017-06-11 DIAGNOSIS — Z8249 Family history of ischemic heart disease and other diseases of the circulatory system: Secondary | ICD-10-CM | POA: Insufficient documentation

## 2017-06-11 DIAGNOSIS — Z79899 Other long term (current) drug therapy: Secondary | ICD-10-CM | POA: Insufficient documentation

## 2017-06-11 DIAGNOSIS — Z79890 Hormone replacement therapy (postmenopausal): Secondary | ICD-10-CM | POA: Insufficient documentation

## 2017-06-11 DIAGNOSIS — Z860100 Personal history of colon polyps, unspecified: Secondary | ICD-10-CM

## 2017-06-11 DIAGNOSIS — Z1211 Encounter for screening for malignant neoplasm of colon: Secondary | ICD-10-CM | POA: Insufficient documentation

## 2017-06-11 DIAGNOSIS — Z87891 Personal history of nicotine dependence: Secondary | ICD-10-CM | POA: Insufficient documentation

## 2017-06-11 DIAGNOSIS — I10 Essential (primary) hypertension: Secondary | ICD-10-CM | POA: Insufficient documentation

## 2017-06-11 DIAGNOSIS — E119 Type 2 diabetes mellitus without complications: Secondary | ICD-10-CM | POA: Insufficient documentation

## 2017-06-11 HISTORY — PX: COLONOSCOPY: SHX5424

## 2017-06-11 LAB — GLUCOSE, CAPILLARY: Glucose-Capillary: 162 mg/dL — ABNORMAL HIGH (ref 65–99)

## 2017-06-11 SURGERY — COLONOSCOPY
Anesthesia: Moderate Sedation

## 2017-06-11 MED ORDER — MIDAZOLAM HCL 5 MG/5ML IJ SOLN
INTRAMUSCULAR | Status: AC
  Filled 2017-06-11: qty 10

## 2017-06-11 MED ORDER — MEPERIDINE HCL 50 MG/ML IJ SOLN
INTRAMUSCULAR | Status: AC
  Filled 2017-06-11: qty 1

## 2017-06-11 MED ORDER — SODIUM CHLORIDE 0.9 % IV SOLN
INTRAVENOUS | Status: DC
  Administered 2017-06-11: 1000 mL via INTRAVENOUS

## 2017-06-11 MED ORDER — STERILE WATER FOR IRRIGATION IR SOLN
Status: DC | PRN
  Administered 2017-06-11: 2.5 mL

## 2017-06-11 MED ORDER — MEPERIDINE HCL 50 MG/ML IJ SOLN
INTRAMUSCULAR | Status: DC | PRN
  Administered 2017-06-11: 25 mg via INTRAVENOUS
  Administered 2017-06-11: 25 mg

## 2017-06-11 MED ORDER — ATROPINE SULFATE 1 MG/ML IJ SOLN
INTRAMUSCULAR | Status: DC | PRN
  Administered 2017-06-11: .5 mg via INTRAVENOUS

## 2017-06-11 MED ORDER — MIDAZOLAM HCL 5 MG/5ML IJ SOLN
INTRAMUSCULAR | Status: DC | PRN
  Administered 2017-06-11 (×3): 2 mg via INTRAVENOUS

## 2017-06-11 MED ORDER — ATROPINE SULFATE 1 MG/ML IJ SOLN
INTRAMUSCULAR | Status: AC
  Filled 2017-06-11: qty 1

## 2017-06-11 NOTE — Op Note (Signed)
[facility] Patient Name: Jacob Lowe Procedure Date: 06/11/2017 8:16 AM MRN: [id number] Date of Birth: 1945-11-07 Attending MD: Lionel December , MD CSN: [id number] Age: 72 Admit Type: Outpatient Procedure:                Colonoscopy Indications:              High risk colon cancer surveillance: Personal                            history of colonic polyps Providers:                Lionel December, MD, Nena Polio, RN Referring MD:             Angelique Holm, NP Medicines:                Meperidine 50 mg IV, Midazolam 6 mg IV, Atropine                            0.5 mg IV Complications:            No immediate complications. Estimated Blood Loss:     Estimated blood loss: none. Procedure:                Pre-Anesthesia Assessment:                           - Prior to the procedure, a History and Physical                            was performed, and patient medications and                            allergies were reviewed. The patient's tolerance of                            previous anesthesia was also reviewed. The risks                            and benefits of the procedure and the sedation                            options and risks were discussed with the patient.                            All questions were answered, and informed consent                            was obtained. Prior Anticoagulants: The patient has                            taken no previous anticoagulant or antiplatelet                            agents. ASA Grade Assessment: II - A patient with  mild systemic disease. After reviewing the risks                            and benefits, the patient was deemed in                            satisfactory condition to undergo the procedure.                           After obtaining informed consent, the colonoscope                            was passed under direct vision. Throughout the                            procedure, the  patient's blood pressure, pulse, and                            oxygen saturations were monitored continuously. The                            [id number] ([id number] scope was introduced through                            the anus and advanced to the the cecum, identified                            by appendiceal orifice and ileocecal valve. The                            colonoscopy was somewhat difficult due to a                            tortuous hepatic flexure. Successful completion of                            the procedure was aided by changing the patient to                            a supine position and using manual pressure. The                            patient tolerated the procedure well. The quality                            of the bowel preparation was excellent. The                            ileocecal valve, appendiceal orifice, and rectum                            were photographed. Scope In: 8:43:11 AM Scope Out: 9:02:11 AM Scope Withdrawal Time: 0 hours 9 minutes 0 seconds  Total Procedure Duration:  0 hours 19 minutes 0 seconds  Findings:      The perianal and digital rectal examinations were normal.      The colon (entire examined portion) appeared normal.      Internal hemorrhoids were found during retroflexion. The hemorrhoids       were small. Impression:               - The entire examined colon is normal.                           - Internal hemorrhoids.                           - No specimens collected. Moderate Sedation:      Moderate (conscious) sedation was administered by the endoscopy nurse       and supervised by the endoscopist. The following parameters were       monitored: oxygen saturation, heart rate, blood pressure, CO2       capnography and response to care. Total physician intraservice time was       27 minutes. Recommendation:           - Patient has a contact number available for                            emergencies. The signs and  symptoms of potential                            delayed complications were discussed with the                            patient. Return to normal activities tomorrow.                            Written discharge instructions were provided to the                            patient.                           - Resume previous diet today.                           - Continue present medications.                           - Repeat colonoscopy in 7 years for surveillance. Procedure Code(s):        --- Professional ---                           [phone], Colonoscopy, flexible; diagnostic, including                            collection of specimen(s) by brushing or washing,                            when performed (separate procedure)  G0500, Moderate sedation services provided by the                            same physician or other qualified health care                            professional performing a gastrointestinal                            endoscopic service that sedation supports,                            requiring the presence of an independent trained                            observer to assist in the monitoring of the                            patient's level of consciousness and physiological                            status; initial 15 minutes of intra-service time;                            patient age 50 years or older (additional time may                            be reported with [phone], as appropriate)                           [phone], Moderate sedation services provided by the                            same physician or other qualified health care                            professional performing the diagnostic or                            therapeutic service that the sedation supports,                            requiring the presence of an independent trained                            observer to assist in the monitoring of the                             patient's level of consciousness and physiological                            status; each additional 15 minutes intraservice  time (List separately in addition to code for                            primary service) Diagnosis Code(s):        --- Professional ---                           Z86.010, Personal history of colonic polyps                           K64.8, Other hemorrhoids CPT copyright 2017 American [employer] Association. All rights reserved. The codes documented in this report are preliminary and upon coder review may  be revised to meet current compliance requirements. Lionel December, MD Lionel December, MD 06/11/2017 9:10:43 AM This report has been signed electronically. Number of Addenda: 0

## 2017-06-11 NOTE — H&P (Signed)
Jacob LucksDavid Lowe is an 72 y.o. male.   Chief Complaint: Patient is here for colonoscopy. HPI: Patient is 72 year old African-American male with history of colonic adenomas and is here for surveillance colonoscopy.  He denies abdominal pain change in bowel habits or rectal bleeding.  He had a cecal adenoma removed in 2008 but no polyp was identified on his last colonoscopy of July 2013. Family history is negative for CRC. He does not take aspirin or NSAIDs.  Past Medical History:  Diagnosis Date  . Diabetes mellitus   . Hypertension   . Hypertension   . Hyperthyroidism    Resolved  after therapy with I-131  . Hypothyroidism     History reviewed. No pertinent surgical history.  Family History  Problem Relation Age of Onset  . Hypertension Mother   . Aneurysm Mother   . Diabetes Brother   . Diabetes Sister   . Alzheimer's disease Brother    Social History:  reports that he has quit smoking. He has never used smokeless tobacco. He reports that he drinks alcohol. He reports that he does not use drugs.  Allergies: No Known Allergies  Medications Prior to Admission  Medication Sig Dispense Refill  . aspirin 325 MG tablet Take 650 mg by mouth every 6 (six) hours as needed for moderate pain or headache.     . levothyroxine (SYNTHROID, LEVOTHROID) 100 MCG tablet TAKE 1 TABLET BY MOUTH EVERY DAY BEFORE BREAKFAST 90 tablet 1  . losartan-hydrochlorothiazide (HYZAAR) 100-12.5 MG tablet Take 1 tablet by mouth daily.     . metoprolol (LOPRESSOR) 50 MG tablet Take 50 mg by mouth 2 (two) times daily.    . verapamil (CALAN-SR) 180 MG CR tablet Take 180 mg by mouth at bedtime.    . metFORMIN (GLUCOPHAGE) 500 MG tablet Take 500 mg by mouth 2 (two) times daily with a meal.      Results for orders placed or performed during the hospital encounter of 06/11/17 (from the past 48 hour(s))  Glucose, capillary     Status: Abnormal   Collection Time: 06/11/17  7:56 AM  Result Value Ref Range   Glucose-Capillary 162 (H) 65 - 99 mg/dL   No results found.  ROS  Blood pressure (!) 174/67, pulse (!) 59, temperature 97.8 F (36.6 C), temperature source Oral, resp. rate 16, height 6\' 1"  (1.854 m), weight 208 lb (94.3 kg), SpO2 100 %. Physical Exam  Constitutional: He appears well-developed and well-nourished.  HENT:  Mouth/Throat: Oropharynx is clear and moist.  Eyes: Conjunctivae are normal. No scleral icterus.  Neck: No thyromegaly present.  Cardiovascular: Normal rate, regular rhythm and normal heart sounds.  No murmur heard. Respiratory: Effort normal and breath sounds normal.  GI: Soft. He exhibits no distension and no mass. There is no tenderness.  Musculoskeletal: He exhibits no edema.  Lymphadenopathy:    He has no cervical adenopathy.  Neurological: He is alert.  Skin: Skin is warm and dry.     Assessment/Plan History of colonic adenoma. Surveillance colonoscopy.  Lionel DecemberNajeeb Silvestre Mines, MD 06/11/2017, 8:31 AM

## 2017-06-11 NOTE — Discharge Instructions (Signed)
Medication list updated.  Aspirin deleted from the list. Resume other medications and diet as before. No driving for 24 hours. Next colonoscopy in 7 years.   Colonoscopy, Adult, Care After This sheet gives you information about how to care for yourself after your procedure. Your doctor may also give you more specific instructions. If you have problems or questions, call your doctor. Follow these instructions at home: General instructions   For the first 24 hours after the procedure: ? Do not drive or use machinery. ? Do not sign important documents. ? Do not drink alcohol. ? Do your daily activities more slowly than normal. ? Eat foods that are soft and easy to digest. ? Rest often.  Take over-the-counter or prescription medicines only as told by your doctor.  It is up to you to get the results of your procedure. Ask your doctor, or the department performing the procedure, when your results will be ready. To help cramping and bloating:  Try walking around.  Put heat on your belly (abdomen) as told by your doctor. Use a heat source that your doctor recommends, such as a moist heat pack or a heating pad. ? Put a towel between your skin and the heat source. ? Leave the heat on for 20-30 minutes. ? Remove the heat if your skin turns bright red. This is especially important if you cannot feel pain, heat, or cold. You can get burned. Eating and drinking  Drink enough fluid to keep your pee (urine) clear or pale yellow.  Return to your normal diet as told by your doctor. Avoid heavy or fried foods that are hard to digest.  Avoid drinking alcohol for as long as told by your doctor. Contact a doctor if:  You have blood in your poop (stool) 2-3 days after the procedure. Get help right away if:  You have more than a small amount of blood in your poop.  You see large clumps of tissue (blood clots) in your poop.  Your belly is swollen.  You feel sick to your stomach  (nauseous).  You throw up (vomit).  You have a fever.  You have belly pain that gets worse, and medicine does not help your pain. This information is not intended to replace advice given to you by your health care provider. Make sure you discuss any questions you have with your health care provider. Document Released: 01/26/2010 Document Revised: 09/18/2015 Document Reviewed: 09/18/2015 Elsevier Interactive Patient Education  2017 [employer]

## 2017-06-17 ENCOUNTER — Encounter (HOSPITAL_COMMUNITY): Payer: Self-pay | Admitting: Internal Medicine

## 2017-10-04 ENCOUNTER — Other Ambulatory Visit: Payer: Self-pay | Admitting: "Endocrinology

## 2017-11-13 ENCOUNTER — Ambulatory Visit (INDEPENDENT_AMBULATORY_CARE_PROVIDER_SITE_OTHER): Payer: BLUE CROSS/BLUE SHIELD | Admitting: "Endocrinology

## 2017-11-13 ENCOUNTER — Encounter: Payer: Self-pay | Admitting: "Endocrinology

## 2017-11-13 VITALS — BP 150/88 | HR 66 | Ht 71.0 in | Wt 217.0 lb

## 2017-11-13 DIAGNOSIS — E119 Type 2 diabetes mellitus without complications: Secondary | ICD-10-CM

## 2017-11-13 DIAGNOSIS — I10 Essential (primary) hypertension: Secondary | ICD-10-CM | POA: Diagnosis not present

## 2017-11-13 DIAGNOSIS — E890 Postprocedural hypothyroidism: Secondary | ICD-10-CM | POA: Diagnosis not present

## 2017-11-13 MED ORDER — METFORMIN HCL 1000 MG PO TABS: 1000.0000 mg | ORAL_TABLET | Freq: Two times a day (BID) | ORAL | 1 refills | Status: DC

## 2017-11-13 MED ORDER — LEVOTHYROXINE SODIUM 100 MCG PO TABS: ORAL_TABLET | ORAL | 1 refills | Status: DC

## 2017-11-13 NOTE — Patient Instructions (Signed)

## 2017-11-13 NOTE — Progress Notes (Signed)
Endocrinology follow-up note   Subjective:    Patient ID: Jacob Lowe, male    DOB: May 31, 1945,   Past Medical History:  Diagnosis Date  . Diabetes mellitus   . Hypertension   . Hypertension   . Hyperthyroidism    Resolved  after therapy with I-131  . Hypothyroidism    Past Surgical History:  Procedure Laterality Date  . COLONOSCOPY N/A 06/11/2017   Procedure: COLONOSCOPY;  Surgeon: Malissa Hippo, MD;  Location: [facility];  Service: Endoscopy;  Laterality: N/A;  830   Social History   Socioeconomic History  . Marital status: Married    Spouse name: Not on file  . Number of children: Not on file  . Years of education: Not on file  . Highest education level: Not on file  Occupational History  . Not on file  Social Needs  . Financial resource strain: Not on file  . Food insecurity:    Worry: Not on file    Inability: Not on file  . Transportation needs:    Medical: Not on file    Non-medical: Not on file  Tobacco Use  . Smoking status: Former Games developer  . Smokeless tobacco: Never Used  Substance and Sexual Activity  . Alcohol use: Yes    Alcohol/week: 0.0 standard drinks    Comment: one to two beer per day  . Drug use: No  . Sexual activity: Not on file  Lifestyle  . Physical activity:    Days per week: Not on file    Minutes per session: Not on file  . Stress: Not on file  Relationships  . Social connections:    Talks on phone: Not on file    Gets together: Not on file    Attends religious service: Not on file    Active member of club or organization: Not on file    Attends meetings of clubs or organizations: Not on file    Relationship status: Not on file  Other Topics Concern  . Not on file  Social History Narrative  . Not on file   Outpatient Encounter Medications as of 11/13/2017  Medication Sig  . olmesartan-hydrochlorothiazide (BENICAR HCT) 40-25 MG tablet Take 1 tablet by mouth daily.  Marland Kitchen amLODipine (NORVASC) 5 MG tablet Take 5 mg by mouth  daily.  . carvedilol (COREG) 25 MG tablet Take 25 mg by mouth 2 (two) times daily.  Marland Kitchen levothyroxine (SYNTHROID, LEVOTHROID) 100 MCG tablet TAKE 1 TABLET BY MOUTH EVERY DAY BEFORE BREAKFAST  . metFORMIN (GLUCOPHAGE) 1000 MG tablet Take 1 tablet (1,000 mg total) by mouth 2 (two) times daily with a meal.  . [DISCONTINUED] levothyroxine (SYNTHROID, LEVOTHROID) 100 MCG tablet TAKE 1 TABLET BY MOUTH EVERY DAY BEFORE BREAKFAST  . [DISCONTINUED] losartan-hydrochlorothiazide (HYZAAR) 100-12.5 MG tablet Take 1 tablet by mouth daily.   . [DISCONTINUED] metFORMIN (GLUCOPHAGE) 500 MG tablet Take 500 mg by mouth 2 (two) times daily with a meal.  . [DISCONTINUED] metoprolol (LOPRESSOR) 50 MG tablet Take 50 mg by mouth 2 (two) times daily.  . [DISCONTINUED] verapamil (CALAN-SR) 180 MG CR tablet Take 180 mg by mouth at bedtime.   No facility-administered encounter medications on file as of 11/13/2017.    ALLERGIES: No Known Allergies VACCINATION STATUS:  There is no immunization history on file for this patient.  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    He returns for follow-up  with repeat thyroid function tests.  He is currently on levothyroxine 100 mcg p.o. nightly.      He reports compliance, has a steady weight.  He denies palpitations, heat intolerance.  He sleeps better. -He also has type 2 diabetes on metformin 500 mg p.o. twice daily .  His recent labs show A1c of 7.8% increasing from 7.1%.   -He has difficult to control hypertension on 4 medications, following with his primary medical doctor.  -Denies any headache, chest pain, shortness of breath.   Review of Systems  Constitutional: + Fluctuating body weight,  no fatigue, no subjective hyperthermia/hypothermia Eyes: no blurry vision, no xerophthalmia ENT: no sore throat, no nodules palpated in throat, no dysphagia/odynophagia, no hoarseness Cardiovascular: No  chest pain, no palpitations, no leg swelling.   Musculoskeletal: no muscle/joint aches Skin: no rashes Neurological: no tremors/numbness/tingling/dizziness Psychiatric: no depression/anxiety  Objective:    BP (!) 150/88   Pulse 66   Ht 5\' 11"  (1.803 m)   Wt 217 lb (98.4 kg)   BMI 30.27 kg/m   Wt Readings from Last 3 Encounters:  11/13/17 217 lb (98.4 kg)  06/11/17 208 lb (94.3 kg)  05/13/17 211 lb (95.7 kg)    Physical Exam Constitutional: overweight, in NAD Eyes: PERRLA, EOMI, no exophthalmos ENT: moist mucous membranes, no thyromegaly, no cervical lymphadenopathy Musculoskeletal: no deformities, strength intact in all 4 Skin: moist, warm, no rashes Neurological: No tremors of outstretched hands   Recent Results (from the past 2160 hour(s))  COMPLETE METABOLIC PANEL WITH GFR     Status: Abnormal   Collection Time: 11/07/17 10:32 AM  Result Value Ref Range   Glucose, Bld 144 (H) 65 - 99 mg/dL    Comment: .            Fasting reference interval . For someone without known diabetes, a glucose value >125 mg/dL indicates that they may have diabetes and this should be confirmed with a follow-up test. .    BUN 18 7 - 25 mg/dL   Creat 1.61 0.96 - 0.45 mg/dL    Comment: For patients >71 years of age, the reference limit for Creatinine is approximately 13% higher for people identified as African-American. .    GFR, Est Non African American 85 > OR = 60 mL/min/1.10m2   GFR, Est African American 99 > OR = 60 mL/min/1.76m2   BUN/Creatinine Ratio NOT APPLICABLE 6 - 22 (calc)   Sodium 138 135 - 146 mmol/L   Potassium 4.8 3.5 - 5.3 mmol/L   Chloride 101 98 - 110 mmol/L   CO2 27 20 - 32 mmol/L   Calcium 10.1 8.6 - 10.3 mg/dL   Total Protein 7.8 6.1 - 8.1 g/dL   Albumin 4.9 3.6 - 5.1 g/dL   Globulin 2.9 1.9 - 3.7 g/dL (calc)   AG Ratio 1.7 1.0 - 2.5 (calc)   Total Bilirubin 0.7 0.2 - 1.2 mg/dL   Alkaline phosphatase (APISO) 53 40 - 115 U/L   AST 16 10 - 35 U/L   ALT 15  9 - 46 U/L  Hemoglobin A1c     Status: Abnormal   Collection Time: 11/07/17 10:32 AM  Result Value Ref Range   Hgb A1c MFr Bld 7.8 (H) <5.7 % of total Hgb    Comment: For someone without known diabetes, a hemoglobin A1c value of 6.5% or greater indicates that they may have  diabetes and this should be confirmed with a follow-up  test. . For someone with known diabetes,  a value <7% indicates  that their diabetes is well controlled and a value  greater than or equal to 7% indicates suboptimal  control. A1c targets should be individualized based on  duration of diabetes, age, comorbid conditions, and  other considerations. . Currently, no consensus exists regarding use of hemoglobin A1c for diagnosis of diabetes for children. .    Mean Plasma Glucose 177 (calc)   eAG (mmol/L) 9.8 (calc)  T4, free     Status: None   Collection Time: 11/07/17 10:32 AM  Result Value Ref Range   Free T4 1.5 0.8 - 1.8 ng/dL  TSH     Status: None   Collection Time: 11/07/17 10:32 AM  Result Value Ref Range   TSH 0.61 0.40 - 4.50 mIU/L   Recent A1c was 6.8%.  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 -He is advised to continue  levothyroxine 100 g by mouth every morning.   - We discussed about correct intake of levothyroxine, at fasting, with water, separated by at least 30 minutes from breakfast, and separated by more than 4 hours from calcium, iron, multivitamins, acid reflux medications (PPIs). -Patient is made aware of the fact that thyroid hormone replacement is needed for life, dose to be adjusted by periodic monitoring of thyroid function tests.  2.  Hypertension: -His blood pressure is better today. -He has 4 different medications for blood pressure including verapamil, metoprolol, olmesartan /and hydrochlorthiazide 40/25.  -No symptoms.  I will add aldosterone , renin activity with ratio on his subsequent  visit  to rule out hyperaldosteronism.   -He is advised to follow-up with his PMD in 4 weeks for high blood pressure and electrocardiogram.  3. Diabetes mellitus without complication (HCC) -He returns with worsening A1c of 7.8% from 7.1%.  He is at risk of complications of diabetes.  - I advised him to increase his metformin to 1000 mg by mouth twice a day.  -  Suggestion is made for him to avoid simple carbohydrates  from his diet including Cakes, Sweet Desserts / Pastries, Ice Cream, Soda (diet and regular), Sweet Tea, Candies, Chips, Cookies, Store Bought Juices, Alcohol in Excess of  1-2 drinks a day, Artificial Sweeteners, and "Sugar-free" Products. This will help patient to have stable blood glucose profile and potentially avoid unintended weight gain.  - I advised patient to maintain close follow up with PCP for primary care needs.   - Time spent with the patient: 25 min, of which >50% was spent in reviewing his blood glucose logs , discussing his hypo- and hyper-glycemic episodes, reviewing his current and  previous labs and insulin doses and developing a plan to avoid hypo- and hyper-glycemia. Please refer to Patient Instructions for Blood Glucose Monitoring and Insulin/Medications Dosing Guide"  in media tab for additional information. Jacob Lowe participated in the discussions, expressed understanding, and voiced agreement with the above plans.  All questions were answered to his satisfaction. he is encouraged to contact clinic should he have any questions or concerns prior to his return visit.   Follow up plan: Return in about 6 months (around 05/14/2018) for Follow up with Pre-visit Labs.  Marquis Lunch, MD Phone: [phone]  Fax: [phone]   11/13/2017, 3:47 PM

## 2017-11-14 LAB — COMPLETE METABOLIC PANEL WITH GFR
AG Ratio: 1.7 (calc) (ref 1.0–2.5)
ALT: 15 U/L (ref 9–46)
AST: 16 U/L (ref 10–35)
Albumin: 4.9 g/dL (ref 3.6–5.1)
Alkaline phosphatase (APISO): 53 U/L (ref 40–115)
BUN: 18 mg/dL (ref 7–25)
CO2: 27 mmol/L (ref 20–32)
Calcium: 10.1 mg/dL (ref 8.6–10.3)
Chloride: 101 mmol/L (ref 98–110)
Creat: 0.91 mg/dL (ref 0.70–1.18)
GFR, Est African American: 99 mL/min/{1.73_m2} (ref >=60)
GFR, Est Non African American: 85 mL/min/{1.73_m2} (ref >=60)
Globulin: 2.9 g/dL (calc) (ref 1.9–3.7)
Glucose, Bld: 144 mg/dL — ABNORMAL HIGH (ref 65–99)
Potassium: 4.8 mmol/L (ref 3.5–5.3)
Sodium: 138 mmol/L (ref 135–146)
Total Bilirubin: 0.7 mg/dL (ref 0.2–1.2)
Total Protein: 7.8 g/dL (ref 6.1–8.1)

## 2017-11-14 LAB — HEMOGLOBIN A1C
Hgb A1c MFr Bld: 7.8 % of total Hgb — ABNORMAL HIGH (ref <5.7)
Mean Plasma Glucose: 177 (calc)
eAG (mmol/L): 9.8 (calc)

## 2017-11-14 LAB — ALDOSTERONE + RENIN ACTIVITY W/ RATIO
ALDO / PRA Ratio: 0.3 Ratio — ABNORMAL LOW (ref 0.9–28.9)
Aldosterone: 2 ng/dL
Renin Activity: 6.01 ng/mL/h — ABNORMAL HIGH (ref 0.25–5.82)

## 2017-11-14 LAB — T4, FREE: Free T4: 1.5 ng/dL (ref 0.8–1.8)

## 2017-11-14 LAB — TSH: TSH: 0.61 mIU/L (ref 0.40–4.50)

## 2018-05-04 ENCOUNTER — Other Ambulatory Visit: Payer: Self-pay | Admitting: "Endocrinology

## 2018-05-14 ENCOUNTER — Ambulatory Visit: Payer: BLUE CROSS/BLUE SHIELD | Admitting: "Endocrinology

## 2018-05-18 ENCOUNTER — Ambulatory Visit: Payer: BLUE CROSS/BLUE SHIELD | Admitting: "Endocrinology

## 2018-06-16 ENCOUNTER — Other Ambulatory Visit: Payer: Self-pay | Admitting: "Endocrinology

## 2018-06-16 DIAGNOSIS — E890 Postprocedural hypothyroidism: Secondary | ICD-10-CM

## 2018-06-16 DIAGNOSIS — E119 Type 2 diabetes mellitus without complications: Secondary | ICD-10-CM

## 2018-06-16 DIAGNOSIS — E785 Hyperlipidemia, unspecified: Secondary | ICD-10-CM

## 2018-06-17 LAB — COMPREHENSIVE METABOLIC PANEL
AG Ratio: 1.5 (calc) (ref 1.0–2.5)
ALT: 11 U/L (ref 9–46)
AST: 13 U/L (ref 10–35)
Albumin: 4.7 g/dL (ref 3.6–5.1)
Alkaline phosphatase (APISO): 56 U/L (ref 35–144)
BUN: 13 mg/dL (ref 7–25)
CO2: 28 mmol/L (ref 20–32)
Calcium: 10.2 mg/dL (ref 8.6–10.3)
Chloride: 100 mmol/L (ref 98–110)
Creat: 0.85 mg/dL (ref 0.70–1.18)
Globulin: 3.2 g/dL (calc) (ref 1.9–3.7)
Glucose, Bld: 131 mg/dL — ABNORMAL HIGH (ref 65–99)
Potassium: 4.2 mmol/L (ref 3.5–5.3)
Sodium: 136 mmol/L (ref 135–146)
Total Bilirubin: 0.5 mg/dL (ref 0.2–1.2)
Total Protein: 7.9 g/dL (ref 6.1–8.1)

## 2018-06-17 LAB — LIPID PANEL
Cholesterol: 172 mg/dL (ref <200)
HDL: 35 mg/dL — ABNORMAL LOW (ref >=40)
LDL Cholesterol (Calc): 107 mg/dL (calc) — ABNORMAL HIGH
Non-HDL Cholesterol (Calc): 137 mg/dL (calc) — ABNORMAL HIGH (ref <130)
Total CHOL/HDL Ratio: 4.9 (calc) (ref <5.0)
Triglycerides: 181 mg/dL — ABNORMAL HIGH (ref <150)

## 2018-06-17 LAB — T4, FREE: Free T4: 1.5 ng/dL (ref 0.8–1.8)

## 2018-06-17 LAB — HEMOGLOBIN A1C
Hgb A1c MFr Bld: 6.8 % of total Hgb — ABNORMAL HIGH (ref <5.7)
Mean Plasma Glucose: 148 (calc)
eAG (mmol/L): 8.2 (calc)

## 2018-06-17 LAB — TSH: TSH: 0.38 mIU/L — ABNORMAL LOW (ref 0.40–4.50)

## 2018-06-23 ENCOUNTER — Encounter: Payer: Self-pay | Admitting: "Endocrinology

## 2018-06-23 ENCOUNTER — Ambulatory Visit (INDEPENDENT_AMBULATORY_CARE_PROVIDER_SITE_OTHER): Payer: BC Managed Care – PPO | Admitting: "Endocrinology

## 2018-06-23 ENCOUNTER — Other Ambulatory Visit: Payer: Self-pay

## 2018-06-23 VITALS — BP 166/80 | HR 63 | Ht 71.0 in | Wt 213.0 lb

## 2018-06-23 DIAGNOSIS — E890 Postprocedural hypothyroidism: Secondary | ICD-10-CM

## 2018-06-23 DIAGNOSIS — E119 Type 2 diabetes mellitus without complications: Secondary | ICD-10-CM | POA: Diagnosis not present

## 2018-06-23 DIAGNOSIS — I10 Essential (primary) hypertension: Secondary | ICD-10-CM

## 2018-06-23 MED ORDER — AMLODIPINE BESYLATE 5 MG PO TABS: 5.0000 mg | ORAL_TABLET | Freq: Every day | ORAL | 1 refills | Status: DC

## 2018-06-23 MED ORDER — OLMESARTAN MEDOXOMIL-HCTZ 40-25 MG PO TABS: 1.0000 | ORAL_TABLET | Freq: Every day | ORAL | 1 refills | Status: DC

## 2018-06-23 MED ORDER — CARVEDILOL 25 MG PO TABS: 25.0000 mg | ORAL_TABLET | Freq: Two times a day (BID) | ORAL | 1 refills | Status: DC

## 2018-06-23 MED ORDER — LEVOTHYROXINE SODIUM 100 MCG PO TABS: ORAL_TABLET | ORAL | 1 refills | Status: DC

## 2018-06-23 MED ORDER — METFORMIN HCL 1000 MG PO TABS: 1000.0000 mg | ORAL_TABLET | Freq: Two times a day (BID) | ORAL | 1 refills | Status: DC

## 2018-06-23 NOTE — Patient Instructions (Signed)

## 2018-06-23 NOTE — Progress Notes (Signed)
Endocrinology follow-up note   Subjective:    Patient ID: Jacob Lowe, male    DOB: 08/01/1945,   Past Medical History:  Diagnosis Date  . Diabetes mellitus   . Hypertension   . Hypertension   . Hyperthyroidism    Resolved  after therapy with I-131  . Hypothyroidism    Past Surgical History:  Procedure Laterality Date  . COLONOSCOPY N/A 06/11/2017   Procedure: COLONOSCOPY;  Surgeon: Rogene Houston, MD;  Location: [facility];  Service: Endoscopy;  Laterality: N/A;  830   Social History   Socioeconomic History  . Marital status: Married    Spouse name: Not on file  . Number of children: Not on file  . Years of education: Not on file  . Highest education level: Not on file  Occupational History  . Not on file  Social Needs  . Financial resource strain: Not on file  . Food insecurity    Worry: Not on file    Inability: Not on file  . Transportation needs    Medical: Not on file    Non-medical: Not on file  Tobacco Use  . Smoking status: Former Research scientist (life sciences)  . Smokeless tobacco: Never Used  Substance and Sexual Activity  . Alcohol use: Yes    Alcohol/week: 0.0 standard drinks    Comment: one to two beer per day  . Drug use: No  . Sexual activity: Not on file  Lifestyle  . Physical activity    Days per week: Not on file    Minutes per session: Not on file  . Stress: Not on file  Relationships  . Social Herbalist on phone: Not on file    Gets together: Not on file    Attends religious service: Not on file    Active member of club or organization: Not on file    Attends meetings of clubs or organizations: Not on file    Relationship status: Not on file  Other Topics Concern  . Not on file  Social History Narrative  . Not on file   Outpatient Encounter Medications as of 06/23/2018  Medication Sig  . amLODipine (NORVASC) 5 MG tablet Take 1 tablet (5 mg total) by mouth daily.  . carvedilol (COREG) 25 MG tablet Take 1 tablet (25 mg total) by mouth 2  (two) times daily.  Marland Kitchen levothyroxine (SYNTHROID) 100 MCG tablet TAKE 1 TABLET BY MOUTH EVERY DAY BEFORE BREAKFAST  . metFORMIN (GLUCOPHAGE) 1000 MG tablet Take 1 tablet (1,000 mg total) by mouth 2 (two) times daily with a meal.  . olmesartan-hydrochlorothiazide (BENICAR HCT) 40-25 MG tablet Take 1 tablet by mouth daily.  . [DISCONTINUED] amLODipine (NORVASC) 5 MG tablet Take 5 mg by mouth daily.  . [DISCONTINUED] carvedilol (COREG) 25 MG tablet Take 25 mg by mouth 2 (two) times daily.  . [DISCONTINUED] levothyroxine (SYNTHROID, LEVOTHROID) 100 MCG tablet TAKE 1 TABLET BY MOUTH EVERY DAY BEFORE BREAKFAST  . [DISCONTINUED] metFORMIN (GLUCOPHAGE) 1000 MG tablet TAKE 1 TABLET BY MOUTH TWICE A DAY WITH MEALS  . [DISCONTINUED] olmesartan-hydrochlorothiazide (BENICAR HCT) 40-25 MG tablet Take 1 tablet by mouth daily.   No facility-administered encounter medications on file as of 06/23/2018.    ALLERGIES: No Known Allergies VACCINATION STATUS:  There is no immunization history on file for this patient.  HPI  73 yr old patient with Graves' disease status post radioactive iodine ablation of the thyroid on June 15, 2011.   -He is here to  follow-up for type 2 diabetes, RAI induced hypothyroidism, hypertension.  He returns for follow-up with repeat thyroid function tests.  He is currently on levothyroxine 100 mcg p.o. daily before breakfast, metformin 1000 mg p.o. twice daily, and for blood pressure medications.  He reports compliance to his medications.  He lost 4 pounds since last visit.   He denies palpitations, heat intolerance.  He sleeps better. -He also has type 2 diabetes on metformin 500 mg p.o. twice daily .  His recent labs show A1c of 6.8% improving from 7.8%.    -He has difficult to control hypertension on 4 medications, following with his primary medical doctor.  -Denies any headache, chest pain, shortness of breath.   Review of Systems  Constitutional: + Fluctuating body weight,   no fatigue, no subjective hyperthermia/hypothermia Eyes: no blurry vision, no xerophthalmia ENT: no sore throat, no nodules palpated in throat, no dysphagia/odynophagia, no hoarseness Cardiovascular: No chest pain, no palpitations, no leg swelling.   Musculoskeletal: no muscle/joint aches Skin: no rashes Neurological: no tremors/numbness/tingling/dizziness Psychiatric: no depression/anxiety  Objective:    BP (!) 166/80   Pulse 63   Ht 5\' 11"  (1.803 m)   Wt 213 lb (96.6 kg)   BMI 29.71 kg/m   Wt Readings from Last 3 Encounters:  06/23/18 213 lb (96.6 kg)  11/13/17 217 lb (98.4 kg)  06/11/17 208 lb (94.3 kg)    Physical Exam Constitutional:  not in acute distress, normal state of mind Eyes:  EOMI, no exophthalmos Neck: Supple Respiratory: Adequate breathing efforts Musculoskeletal: no gross deformities, strength intact in all four extremities Skin:  no rashes, no hyperemia Neurological: no tremor with outstretched hands.  Recent Results (from the past 2160 hour(s))  Hemoglobin A1c     Status: Abnormal   Collection Time: 06/16/18  2:08 PM  Result Value Ref Range   Hgb A1c MFr Bld 6.8 (H) <5.7 % of total Hgb    Comment: For someone without known diabetes, a hemoglobin A1c value of 6.5% or greater indicates that they may have  diabetes and this should be confirmed with a follow-up  test. . For someone with known diabetes, a value <7% indicates  that their diabetes is well controlled and a value  greater than or equal to 7% indicates suboptimal  control. A1c targets should be individualized based on  duration of diabetes, age, comorbid conditions, and  other considerations. . Currently, no consensus exists regarding use of hemoglobin A1c for diagnosis of diabetes for children. .    Mean Plasma Glucose 148 (calc)   eAG (mmol/L) 8.2 (calc)  Comprehensive metabolic panel     Status: Abnormal   Collection Time: 06/16/18  2:08 PM  Result Value Ref Range   Glucose, Bld  131 (H) 65 - 99 mg/dL    Comment: .            Fasting reference interval . For someone without known diabetes, a glucose value >125 mg/dL indicates that they may have diabetes and this should be confirmed with a follow-up test. .    BUN 13 7 - 25 mg/dL   Creat 1.610.85 0.960.70 - 0.451.18 mg/dL    Comment: For patients >71 years of age, the reference limit for Creatinine is approximately 13% higher for people identified as African-American. .    BUN/Creatinine Ratio NOT APPLICABLE 6 - 22 (calc)   Sodium 136 135 - 146 mmol/L   Potassium 4.2 3.5 - 5.3 mmol/L   Chloride 100 98 - 110 mmol/L  CO2 28 20 - 32 mmol/L   Calcium 10.2 8.6 - 10.3 mg/dL   Total Protein 7.9 6.1 - 8.1 g/dL   Albumin 4.7 3.6 - 5.1 g/dL   Globulin 3.2 1.9 - 3.7 g/dL (calc)   AG Ratio 1.5 1.0 - 2.5 (calc)   Total Bilirubin 0.5 0.2 - 1.2 mg/dL   Alkaline phosphatase (APISO) 56 35 - 144 U/L   AST 13 10 - 35 U/L   ALT 11 9 - 46 U/L  TSH     Status: Abnormal   Collection Time: 06/16/18  2:08 PM  Result Value Ref Range   TSH 0.38 (L) 0.40 - 4.50 mIU/L  T4, Free     Status: None   Collection Time: 06/16/18  2:08 PM  Result Value Ref Range   Free T4 1.5 0.8 - 1.8 ng/dL  Lipid Panel     Status: Abnormal   Collection Time: 06/16/18  2:08 PM  Result Value Ref Range   Cholesterol 172 <200 mg/dL   HDL 35 (L) > OR = 40 mg/dL   Triglycerides 191181 (H) <150 mg/dL   LDL Cholesterol (Calc) 107 (H) mg/dL (calc)    Comment: Reference range: <100 . Desirable range <100 mg/dL for primary prevention;   <70 mg/dL for patients with CHD or diabetic patients  with > or = 2 CHD risk factors. Marland Kitchen. LDL-C is now calculated using the Martin-Hopkins  calculation, which is a validated novel method providing  better accuracy than the Friedewald equation in the  estimation of LDL-C.  Horald PollenMartin SS et al. Lenox AhrJAMA. 4782;956(212013;310(19): 2061-2068  (http://education.[facility].com/faq/FAQ164)    Total CHOL/HDL Ratio 4.9 <5.0 (calc)   Non-HDL  Cholesterol (Calc) 137 (H) <130 mg/dL (calc)    Comment: For patients with diabetes plus 1 major ASCVD risk  factor, treating to a non-HDL-C goal of <100 mg/dL  (LDL-C of <30<70 mg/dL) is considered a therapeutic  option.    Recent A1c was 6.8%.  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   -He is advised to continue  levothyroxine 100 g by mouth every morning.   - We discussed about the correct intake of his thyroid hormone, on empty stomach at fasting, with water, separated by at least 30 minutes from breakfast and other medications,  and separated by more than 4 hours from calcium, iron, multivitamins, acid reflux medications (PPIs). -Patient is made aware of the fact that thyroid hormone replacement is needed for life, dose to be adjusted by periodic monitoring of thyroid function tests.   2.  Hypertension: -His blood pressure is still above target, however it is better than previous visits.   -He has 4 different medications for blood pressure including amlodipine 5 mg p.o. daily, carvedilol 25 mg p.o. twice daily, olmesartan /and hydrochlorthiazide 40/25. -He is advised on more exercise and salt restrictions.   3. Diabetes mellitus without complication (HCC) -He returns with improved A1c of 6.8% from 7.8%.   - I advised him to increase his metformin to 1000 mg by mouth twice a day.  - he  admits there is a room for improvement in his diet and drink choices. -  Suggestion is made for him to avoid simple carbohydrates  from his diet including Cakes, Sweet Desserts / Pastries, Ice Cream, Soda (diet and regular), Sweet Tea, Candies, Chips, Cookies, Sweet Pastries,  Store Bought Juices, Alcohol in Excess of  1-2 drinks a day, Artificial Sweeteners,  Coffee Creamer, and "Sugar-free" Products. This will help patient to have stable blood glucose profile and potentially avoid unintended weight gain.   - I  advised patient to maintain close follow up with PCP for primary care needs.  - Time spent with the patient: 25 min, of which >50% was spent in reviewing his  current and  previous labs/studies, previous treatments, and medications doses and developing a plan for long-term care based on the latest recommendations for standards of care. Please refer to " Patient Self Inventory" in the Media  tab for reviewed elements of pertinent patient history. Jenel Lucksavid Tuccillo participated in the discussions, expressed understanding, and voiced agreement with the above plans.  All questions were answered to his satisfaction. he is encouraged to contact clinic should he have any questions or concerns prior to his return visit.    Follow up plan: Return in about 6 months (around 12/23/2018) for Follow up with Pre-visit Labs.  Marquis LunchGebre Nida, MD Phone: [phone]  Fax: [phone]   06/23/2018, 4:01 PM

## 2018-12-23 LAB — T4, FREE: Free T4: 1.4 ng/dL (ref 0.8–1.8)

## 2018-12-23 LAB — TSH: TSH: 0.92 mIU/L (ref 0.40–4.50)

## 2018-12-23 LAB — HEMOGLOBIN A1C
Hgb A1c MFr Bld: 6.5 % of total Hgb — ABNORMAL HIGH (ref <5.7)
Mean Plasma Glucose: 140 (calc)
eAG (mmol/L): 7.7 (calc)

## 2018-12-24 ENCOUNTER — Other Ambulatory Visit: Payer: Self-pay | Admitting: "Endocrinology

## 2018-12-28 ENCOUNTER — Ambulatory Visit (INDEPENDENT_AMBULATORY_CARE_PROVIDER_SITE_OTHER): Payer: BC Managed Care – PPO | Admitting: "Endocrinology

## 2018-12-28 ENCOUNTER — Encounter: Payer: Self-pay | Admitting: "Endocrinology

## 2018-12-28 DIAGNOSIS — E785 Hyperlipidemia, unspecified: Secondary | ICD-10-CM

## 2018-12-28 DIAGNOSIS — E890 Postprocedural hypothyroidism: Secondary | ICD-10-CM | POA: Diagnosis not present

## 2018-12-28 DIAGNOSIS — I10 Essential (primary) hypertension: Secondary | ICD-10-CM

## 2018-12-28 DIAGNOSIS — E119 Type 2 diabetes mellitus without complications: Secondary | ICD-10-CM | POA: Diagnosis not present

## 2018-12-28 NOTE — Progress Notes (Signed)
12/28/2018                                Endocrinology Telehealth Visit Follow up Note -During COVID -19 Pandemic  I connected with Jacob Lowe on 12/28/2018   by telephone and verified that I am speaking with the correct person using two identifiers. Jacob Lowe, 03-Jul-1945, 73 he has verbally consented to this visit. All issues noted in this document were discussed and addressed. The format was not optimal for physical exam.   Subjective:    Patient ID: Jacob Lowe, male    DOB: 05-02-1945,   Past Medical History:  Diagnosis Date  . Diabetes mellitus   . Hypertension   . Hypertension   . Hyperthyroidism    Resolved  after therapy with I-131  . Hypothyroidism    Past Surgical History:  Procedure Laterality Date  . COLONOSCOPY N/A 06/11/2017   Procedure: COLONOSCOPY;  Surgeon: Rogene Houston, MD;  Location: [facility];  Service: Endoscopy;  Laterality: N/A;  830   Social History   Socioeconomic History  . Marital status: Married    Spouse name: Not on file  . Number of children: Not on file  . Years of education: Not on file  . Highest education level: Not on file  Occupational History  . Not on file  Tobacco Use  . Smoking status: Former Research scientist (life sciences)  . Smokeless tobacco: Never Used  Substance and Sexual Activity  . Alcohol use: Yes    Alcohol/week: 0.0 standard drinks    Comment: one to two beer per day  . Drug use: No  . Sexual activity: Not on file  Other Topics Concern  . Not on file  Social History Narrative  . Not on file   Social Determinants of Health   Financial Resource Strain:   . Difficulty of Paying Living Expenses: Not on file  Food Insecurity:   . Worried About Charity fundraiser in the Last Year: Not on file  . Ran Out of Food in the Last Year: Not on file  Transportation Needs:   . Lack of Transportation (Medical): Not on file  . Lack of Transportation (Non-Medical): Not on file  Physical Activity:   . Days of Exercise per Week: Not on  file  . Minutes of Exercise per Session: Not on file  Stress:   . Feeling of Stress : Not on file  Social Connections:   . Frequency of Communication with Friends and Family: Not on file  . Frequency of Social Gatherings with Friends and Family: Not on file  . Attends Religious Services: Not on file  . Active Member of Clubs or Organizations: Not on file  . Attends Archivist Meetings: Not on file  . Marital Status: Not on file   Outpatient Encounter Medications as of 12/28/2018  Medication Sig  . amLODipine (NORVASC) 5 MG tablet Take 1 tablet (5 mg total) by mouth daily.  . carvedilol (COREG) 25 MG tablet Take 1 tablet (25 mg total) by mouth 2 (two) times daily.  Marland Kitchen levothyroxine (SYNTHROID) 100 MCG tablet TAKE 1 TABLET BY MOUTH EVERY DAY BEFORE BREAKFAST  . metFORMIN (GLUCOPHAGE) 1000 MG tablet Take 1 tablet (1,000 mg total) by mouth 2 (two) times daily with a meal.  . olmesartan-hydrochlorothiazide (BENICAR HCT) 40-25 MG tablet Take 1 tablet by mouth daily.   No facility-administered encounter medications on file as of 12/28/2018.  ALLERGIES: No Known Allergies VACCINATION STATUS:  There is no immunization history on file for this patient.  HPI  73 yr old patient with Graves' disease status post radioactive iodine ablation of the thyroid on June 15, 2011.   -He is being engaged in telehealth for follow-up of type 2 diabetes, RAI induced hypothyroidism, hypertension.   - He is currently on levothyroxine 100 mcg p.o. daily before breakfast, metformin 1000 mg p.o. twice daily, and for blood pressure medications including Benicar 40-25 mg p.o. daily, carvedilol 25 mg p.o. twice daily, amlodipine 5 mg p.o. daily.  He reports compliance to his medications.    He denies palpitations, heat intolerance.  He sleeps better. -He also has type 2 diabetes on metformin 500 mg p.o. twice daily .  His recent labs show A1c 6.5% improving from 7.8%.   -Denies any headache, chest  pain, shortness of breath.   Review of Systems Limited as above.  Objective:    There were no vitals taken for this visit.  Wt Readings from Last 3 Encounters:  06/23/18 213 lb (96.6 kg)  11/13/17 217 lb (98.4 kg)  06/11/17 208 lb (94.3 kg)      Recent Results (from the past 2160 hour(s))  Hemoglobin A1c     Status: Abnormal   Collection Time: 12/22/18  7:32 AM  Result Value Ref Range   Hgb A1c MFr Bld 6.5 (H) <5.7 % of total Hgb    Comment: For someone without known diabetes, a hemoglobin A1c value of 6.5% or greater indicates that they may have  diabetes and this should be confirmed with a follow-up  test. . For someone with known diabetes, a value <7% indicates  that their diabetes is well controlled and a value  greater than or equal to 7% indicates suboptimal  control. A1c targets should be individualized based on  duration of diabetes, age, comorbid conditions, and  other considerations. . Currently, no consensus exists regarding use of hemoglobin A1c for diagnosis of diabetes for children. .    Mean Plasma Glucose 140 (calc)   eAG (mmol/L) 7.7 (calc)  TSH     Status: None   Collection Time: 12/22/18  7:32 AM  Result Value Ref Range   TSH 0.92 0.40 - 4.50 mIU/L  T4, free     Status: None   Collection Time: 12/22/18  7:32 AM  Result Value Ref Range   Free T4 1.4 0.8 - 1.8 ng/dL   Recent Z3G was 9.9%.  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   -He is advised to continue   levothyroxine 100 g by mouth every morning.   - We discussed about the correct intake of his thyroid hormone, on empty stomach at fasting, with water, separated by at least 30 minutes from breakfast and other medications,  and separated by more than 4 hours from calcium, iron, multivitamins, acid reflux medications (PPIs). -Patient is made aware of the fact that thyroid hormone replacement is needed for  life, dose to be adjusted by periodic monitoring of thyroid function tests.   2.  Hypertension: -His reported home blood pressure readings are still above target,  however it is better than previous visits.   -He advised to continue to be consistent on he is  4 different medications for blood pressure including amlodipine 5 mg p.o. daily, carvedilol 25 mg p.o. twice daily, olmesartan /and hydrochlorthiazide 40/25. -He is advised on  more exercise and salt restrictions.   3. Diabetes mellitus without complication (HCC) -He returns with improved A1c of 6.5% from 7.8%. No hypoglycemia. -He is advised to continue Metformin 1000 mg p.o. twice daily-after breakfast and supper.   - he  admits there is a room for improvement in his diet and drink choices. -  Suggestion is made for him to avoid simple carbohydrates  from his diet including Cakes, Sweet Desserts / Pastries, Ice Cream, Soda (diet and regular), Sweet Tea, Candies, Chips, Cookies, Sweet Pastries,  Store Bought Juices, Alcohol in Excess of  1-2 drinks a day, Artificial Sweeteners, Coffee Creamer, and "Sugar-free" Products. This will help patient to have stable blood glucose profile and potentially avoid unintended weight gain.  4) hyperlipidemia  -- first Documentation. Patient would like to see his next measurement before considering intervention with medications. He will have fasting lipid panel along with his next labs before his next visit.  - I advised patient to maintain close follow up with PCP for primary care needs.  - Patient Care Time Today:  25 min, of which >50% was spent in  counseling and the rest reviewing his  current and  previous labs/studies, previous treatments, his blood glucose readings, and medications' doses and developing a plan for long-term care based on the latest recommendations for standards of care.   Jenel Lucksavid Irmen participated in the discussions, expressed understanding, and voiced agreement with the above  plans.  All questions were answered to his satisfaction. he is encouraged to contact clinic should he have any questions or concerns prior to his return visit.    Follow up plan: Return in about 4 months (around 04/28/2019) for Follow up with Pre-visit Labs, Next Visit A1c in Office.  Marquis LunchGebre Lenus Trauger, MD Phone: [phone]  Fax: [phone]   12/28/2018, 4:56 PM

## 2019-01-19 ENCOUNTER — Other Ambulatory Visit: Payer: Self-pay | Admitting: "Endocrinology

## 2019-02-10 ENCOUNTER — Other Ambulatory Visit: Payer: Self-pay | Admitting: "Endocrinology

## 2019-03-14 ENCOUNTER — Other Ambulatory Visit: Payer: Self-pay | Admitting: "Endocrinology

## 2019-04-12 ENCOUNTER — Other Ambulatory Visit: Payer: Self-pay | Admitting: "Endocrinology

## 2019-04-19 ENCOUNTER — Other Ambulatory Visit: Payer: Self-pay | Admitting: "Endocrinology

## 2019-04-22 LAB — COMPLETE METABOLIC PANEL WITH GFR
AG Ratio: 1.4 (calc) (ref 1.0–2.5)
ALT: 9 U/L (ref 9–46)
AST: 11 U/L (ref 10–35)
Albumin: 4.8 g/dL (ref 3.6–5.1)
Alkaline phosphatase (APISO): 48 U/L (ref 35–144)
BUN/Creatinine Ratio: 17 (calc) (ref 6–22)
BUN: 23 mg/dL (ref 7–25)
CO2: 26 mmol/L (ref 20–32)
Calcium: 10.3 mg/dL (ref 8.6–10.3)
Chloride: 103 mmol/L (ref 98–110)
Creat: 1.33 mg/dL — ABNORMAL HIGH (ref 0.70–1.18)
GFR, Est African American: 61 mL/min/{1.73_m2} (ref >=60)
GFR, Est Non African American: 53 mL/min/{1.73_m2} — ABNORMAL LOW (ref >=60)
Globulin: 3.4 g/dL (calc) (ref 1.9–3.7)
Glucose, Bld: 114 mg/dL — ABNORMAL HIGH (ref 65–99)
Potassium: 5.4 mmol/L — ABNORMAL HIGH (ref 3.5–5.3)
Sodium: 137 mmol/L (ref 135–146)
Total Bilirubin: 0.5 mg/dL (ref 0.2–1.2)
Total Protein: 8.2 g/dL — ABNORMAL HIGH (ref 6.1–8.1)

## 2019-04-22 LAB — LIPID PANEL
Cholesterol: 164 mg/dL (ref <200)
HDL: 30 mg/dL — ABNORMAL LOW (ref >=40)
LDL Cholesterol (Calc): 100 mg/dL (calc) — ABNORMAL HIGH
Non-HDL Cholesterol (Calc): 134 mg/dL (calc) — ABNORMAL HIGH (ref <130)
Total CHOL/HDL Ratio: 5.5 (calc) — ABNORMAL HIGH (ref <5.0)
Triglycerides: 217 mg/dL — ABNORMAL HIGH (ref <150)

## 2019-04-22 LAB — T4, FREE: Free T4: 1.6 ng/dL (ref 0.8–1.8)

## 2019-04-22 LAB — TSH: TSH: 0.64 mIU/L (ref 0.40–4.50)

## 2019-04-29 ENCOUNTER — Encounter: Payer: Self-pay | Admitting: "Endocrinology

## 2019-04-29 ENCOUNTER — Ambulatory Visit (INDEPENDENT_AMBULATORY_CARE_PROVIDER_SITE_OTHER): Payer: BC Managed Care – PPO | Admitting: "Endocrinology

## 2019-04-29 ENCOUNTER — Other Ambulatory Visit: Payer: Self-pay

## 2019-04-29 VITALS — BP 181/89 | HR 60 | Ht 71.0 in | Wt 208.4 lb

## 2019-04-29 DIAGNOSIS — E890 Postprocedural hypothyroidism: Secondary | ICD-10-CM

## 2019-04-29 DIAGNOSIS — E119 Type 2 diabetes mellitus without complications: Secondary | ICD-10-CM

## 2019-04-29 DIAGNOSIS — E785 Hyperlipidemia, unspecified: Secondary | ICD-10-CM | POA: Diagnosis not present

## 2019-04-29 DIAGNOSIS — I10 Essential (primary) hypertension: Secondary | ICD-10-CM | POA: Diagnosis not present

## 2019-04-29 LAB — POCT GLYCOSYLATED HEMOGLOBIN (HGB A1C): Hemoglobin A1C: 6.4 % — AB (ref 4.0–5.6)

## 2019-04-29 MED ORDER — METFORMIN HCL 500 MG PO TABS: 500.0000 mg | ORAL_TABLET | Freq: Two times a day (BID) | ORAL | 1 refills | Status: DC

## 2019-04-29 MED ORDER — CLONIDINE HCL 0.1 MG PO TABS: 0.1000 mg | ORAL_TABLET | Freq: Two times a day (BID) | ORAL | 1 refills | Status: DC

## 2019-04-29 MED ORDER — ROSUVASTATIN CALCIUM 10 MG PO TABS: 10.0000 mg | ORAL_TABLET | Freq: Every day | ORAL | 3 refills | Status: DC

## 2019-04-29 NOTE — Progress Notes (Signed)
04/29/2019  Endocrinology follow-up note   Subjective:    Patient ID: Jacob Lowe, male    DOB: May 12, 1945,   Past Medical History:  Diagnosis Date  . Diabetes mellitus   . Hypertension   . Hypertension   . Hyperthyroidism    Resolved  after therapy with I-131  . Hypothyroidism    Past Surgical History:  Procedure Laterality Date  . COLONOSCOPY N/A 06/11/2017   Procedure: COLONOSCOPY;  Surgeon: Malissa Hippo, MD;  Location: [facility];  Service: Endoscopy;  Laterality: N/A;  830   Social History   Socioeconomic History  . Marital status: Married    Spouse name: Not on file  . Number of children: Not on file  . Years of education: Not on file  . Highest education level: Not on file  Occupational History  . Not on file  Tobacco Use  . Smoking status: Former Games developer  . Smokeless tobacco: Never Used  Substance and Sexual Activity  . Alcohol use: Yes    Alcohol/week: 0.0 standard drinks    Comment: one to two beer per day  . Drug use: No  . Sexual activity: Not on file  Other Topics Concern  . Not on file  Social History Narrative  . Not on file   Social Determinants of Health   Financial Resource Strain:   . Difficulty of Paying Living Expenses:   Food Insecurity:   . Worried About Programme researcher, broadcasting/film/video in the Last Year:   . Barista in the Last Year:   Transportation Needs:   . Freight forwarder (Medical):   Marland Kitchen Lack of Transportation (Non-Medical):   Physical Activity:   . Days of Exercise per Week:   . Minutes of Exercise per Session:   Stress:   . Feeling of Stress :   Social Connections:   . Frequency of Communication with Friends and Family:   . Frequency of Social Gatherings with Friends and Family:   . Attends Religious Services:   . Active Member of Clubs or Organizations:   . Attends Banker Meetings:   Marland Kitchen Marital Status:    Outpatient Encounter Medications as of 04/29/2019  Medication Sig  . carvedilol (COREG) 25  MG tablet TAKE 1 TABLET BY MOUTH TWICE A DAY  . cloNIDine (CATAPRES) 0.1 MG tablet Take 1 tablet (0.1 mg total) by mouth 2 (two) times daily.  Marland Kitchen levothyroxine (SYNTHROID) 100 MCG tablet TAKE 1 TABLET BY MOUTH EVERY DAY BEFORE BREAKFAST  . metFORMIN (GLUCOPHAGE) 500 MG tablet Take 1 tablet (500 mg total) by mouth 2 (two) times daily with a meal.  . olmesartan-hydrochlorothiazide (BENICAR HCT) 40-25 MG tablet TAKE 1 TABLET BY MOUTH EVERY DAY  . rosuvastatin (CRESTOR) 10 MG tablet Take 1 tablet (10 mg total) by mouth daily.  . [DISCONTINUED] amLODipine (NORVASC) 5 MG tablet TAKE 1 TABLET BY MOUTH EVERY DAY  . [DISCONTINUED] metFORMIN (GLUCOPHAGE) 1000 MG tablet TAKE 1 TABLET BY MOUTH TWICE A DAY WITH MEALS   No facility-administered encounter medications on file as of 04/29/2019.   ALLERGIES: No Known Allergies VACCINATION STATUS:  There is no immunization history on file for this patient.  HPI  74 yr old patient with Graves' disease status post radioactive iodine ablation of the thyroid on June 15, 2011.   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   - He is currently on levothyroxine 100 mcg p.o.  daily before breakfast, metformin 1000 mg p.o. twice daily, and for blood pressure medications including Benicar 40-25 mg p.o. daily, carvedilol 25 mg p.o. twice daily, amlodipine 5 mg p.o. daily.  He reports compliance to his medications.  His blood pressure is still uncontrolled.  He denies palpitations, heat intolerance, tremors.   -Denies any headache, chest pain, shortness of breath.   Review of Systems   Review of systems  Constitutional: + Minimally fluctuating body weight,  current  Body mass index is 29.07 kg/m. , no fatigue, no subjective hyperthermia, no subjective hypothermia Eyes: no blurry vision, no xerophthalmia ENT: no sore throat, no nodules palpated in throat, no dysphagia/odynophagia, no  hoarseness Cardiovascular: no Chest Pain, no Shortness of Breath, no palpitations, no leg swelling Respiratory: no cough, no shortness of breath Gastrointestinal: no Nausea/Vomiting/Diarhhea Musculoskeletal: no muscle/joint aches Skin: no rashes, no hyperemia Neurological: no tremors, no numbness, no tingling, no dizziness Psychiatric: no depression, no anxiety   Objective:    BP (!) 181/89   Pulse 60   Ht 5\' 11"  (1.803 m)   Wt 208 lb 6.4 oz (94.5 kg)   BMI 29.07 kg/m   Wt Readings from Last 3 Encounters:  04/29/19 208 lb 6.4 oz (94.5 kg)  06/23/18 213 lb (96.6 kg)  11/13/17 217 lb (98.4 kg)     Physical Exam- Limited  Constitutional:  Body mass index is 29.07 kg/m. , not in acute distress, normal state of mind Eyes:  EOMI, no exophthalmos Neck: Supple Thyroid: No gross goiter Respiratory: Adequate breathing efforts Musculoskeletal: no gross deformities, strength intact in all four extremities, no gross restriction of joint movements Skin:  no rashes, no hyperemia Neurological: no tremor with outstretched hands,    Recent Results (from the past 2160 hour(s))  Lipid Panel     Status: Abnormal   Collection Time: 04/22/19  8:26 AM  Result Value Ref Range   Cholesterol 164 <200 mg/dL   HDL 30 (L) > OR = 40 mg/dL   Triglycerides 04/24/19 (H) <150 mg/dL    Comment: . If a non-fasting specimen was collected, consider repeat triglyceride testing on a fasting specimen if clinically indicated.  353 et al. J. of Clin. Lipidol. 2015;9:129-169. [place]    LDL Cholesterol (Calc) 100 (H) mg/dL (calc)    Comment: Reference range: <100 . Desirable range <100 mg/dL for primary prevention;   <70 mg/dL for patients with CHD or diabetic patients  with > or = 2 CHD risk factors. Marland Kitchen LDL-C is now calculated using the Martin-Hopkins  calculation, which is a validated novel method providing  better accuracy than the Friedewald equation in the  estimation of LDL-C.  Marland Kitchen et al. Horald Pollen.  Lenox Ahr): 2061-2068  (http://education.[facility].com/faq/FAQ164)    Total CHOL/HDL Ratio 5.5 (H) <5.0 (calc)   Non-HDL Cholesterol (Calc) 134 (H) <130 mg/dL (calc)    Comment: For patients with diabetes plus 1 major ASCVD risk  factor, treating to a non-HDL-C goal of <100 mg/dL  (LDL-C of 09[birth year]16-1970 mg/dL) is considered a therapeutic  option.   TSH SOLSTAS     Status: None   Collection Time: 04/22/19  8:26 AM  Result Value Ref Range   TSH 0.64 0.40 - 4.50 mIU/L  T4, free SOLSTAS     Status: None   Collection Time: 04/22/19  8:26 AM  Result Value Ref Range   Free T4 1.6 0.8 - 1.8 ng/dL  COMPLETE METABOLIC PANEL WITH GFR     Status: Abnormal   Collection Time: 04/22/19  8:26 AM  Result Value Ref Range   Glucose, Bld 114 (H) 65 - 99 mg/dL    Comment: .            Fasting reference interval . For someone without known diabetes, a glucose value between 100 and 125 mg/dL is consistent with prediabetes and should be confirmed with a follow-up test. .    BUN 23 7 - 25 mg/dL   Creat 2.83 (H) 1.51 - 1.18 mg/dL    Comment: For patients >53 years of age, the reference limit for Creatinine is approximately 13% higher for people identified as African-American. .    GFR, Est Non African American 53 (L) > OR = 60 mL/min/1.52m2   GFR, Est African American 61 > OR = 60 mL/min/1.14m2   BUN/Creatinine Ratio 17 6 - 22 (calc)   Sodium 137 135 - 146 mmol/L   Potassium 5.4 (H) 3.5 - 5.3 mmol/L   Chloride 103 98 - 110 mmol/L   CO2 26 20 - 32 mmol/L   Calcium 10.3 8.6 - 10.3 mg/dL   Total Protein 8.2 (H) 6.1 - 8.1 g/dL   Albumin 4.8 3.6 - 5.1 g/dL   Globulin 3.4 1.9 - 3.7 g/dL (calc)   AG Ratio 1.4 1.0 - 2.5 (calc)   Total Bilirubin 0.5 0.2 - 1.2 mg/dL   Alkaline phosphatase (APISO) 48 35 - 144 U/L   AST 11 10 - 35 U/L   ALT 9 9 - 46 U/L  HgB A1c     Status: Abnormal   Collection Time: 04/29/19  3:08 PM  Result Value Ref Range   Hemoglobin A1C 6.4 (A) 4.0 - 5.6 %   HbA1c POC (<>  result, manual entry)     HbA1c, POC (prediabetic range)     HbA1c, POC (controlled diabetic range)     Recent A1c was 6.8%.  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  He is advised to continue levothyroxine 100 mcg p.o. daily before breakfast.    - We discussed about the correct intake of his thyroid hormone, on empty stomach at fasting, with water, separated by at least 30 minutes from breakfast and other medications,  and separated by more than 4 hours from calcium, iron, multivitamins, acid reflux medications (PPIs). -Patient is made aware of the fact that thyroid hormone replacement is needed for life, dose to be adjusted by periodic monitoring of thyroid function tests.   2.  Hypertension: -His office blood pressure is always above target.  He reports better blood pressure measurement at home.     -His amlodipine will be discontinued.  He is approached with clonidine 0.5 mg p.o. twice daily along with his carvedilol 25 mg p.o. twice daily, olmesartan /and hydrochlorthiazide 40/25. -He is advised on more exercise and salt restrictions; and continue to monitor blood pressure at home.   3. Diabetes mellitus without complication (HCC) -His point-of-care A1c 6.4%, overall improving from 7.8%.  He denies any hypoglycemia.  His serum creatinine is above normal.  He is advised to lower Metformin to 500 mg p.o. twice daily.    - he  admits there is a room for improvement in his diet and drink choices. -  Suggestion is made for him to avoid simple carbohydrates  from his diet including Cakes, Sweet Desserts / Pastries, Ice Cream, Soda (diet and regular), Sweet Tea, Candies, Chips, Cookies, Sweet Pastries,  Store Bought Juices,  Alcohol in Excess of  1-2 drinks a day, Artificial Sweeteners, Coffee Creamer, and "Sugar-free" Products. This will help patient to have stable blood glucose profile and  potentially avoid unintended weight gain.   4) hyperlipidemia: His lipid panel is unchanged since last visit.  He is approached with Crestor 10 mg p.o. nightly.  He is in agreement and I will prescribe.  Precautions and side effects  discussed with him.  He is advised to maintain close follow-up with his PCP for primary care needs.   - Time spent on this patient care encounter:  35 min, of which > 50% was spent in  counseling and the rest reviewing his blood glucose logs , discussing his hypoglycemia and hyperglycemia episodes, reviewing his current and  previous labs / studies  ( including abstraction from other facilities) and medications  doses and developing a  long term treatment plan and documenting his care.   Please refer to Patient Instructions for Blood Glucose Monitoring and Insulin/Medications Dosing Guide"  in media tab for additional information. Please  also refer to " Patient Self Inventory" in the Media  tab for reviewed elements of pertinent patient history.  Jacob Lowe participated in the discussions, expressed understanding, and voiced agreement with the above plans.  All questions were answered to his satisfaction. he is encouraged to contact clinic should he have any questions or concerns prior to his return visit.    Follow up plan: Return in about 6 months (around 10/29/2019) for Follow up with Pre-visit Labs, Next Visit A1c in Office.  Jacob Lloyd, MD Phone: [phone]  Fax: [phone]   04/29/2019, 5:09 PM

## 2019-04-29 NOTE — Patient Instructions (Signed)

## 2019-06-12 ENCOUNTER — Other Ambulatory Visit: Payer: Self-pay | Admitting: "Endocrinology

## 2019-07-05 ENCOUNTER — Other Ambulatory Visit: Payer: Self-pay | Admitting: "Endocrinology

## 2019-07-11 ENCOUNTER — Other Ambulatory Visit: Payer: Self-pay | Admitting: "Endocrinology

## 2019-07-15 ENCOUNTER — Other Ambulatory Visit: Payer: Self-pay | Admitting: "Endocrinology

## 2019-07-30 ENCOUNTER — Other Ambulatory Visit: Payer: Self-pay | Admitting: "Endocrinology

## 2019-10-24 ENCOUNTER — Other Ambulatory Visit: Payer: Self-pay | Admitting: "Endocrinology

## 2019-10-27 ENCOUNTER — Other Ambulatory Visit: Payer: Self-pay | Admitting: "Endocrinology

## 2019-10-28 LAB — COMPLETE METABOLIC PANEL WITH GFR
AG Ratio: 1.5 (calc) (ref 1.0–2.5)
ALT: 12 U/L (ref 9–46)
AST: 15 U/L (ref 10–35)
Albumin: 4.9 g/dL (ref 3.6–5.1)
Alkaline phosphatase (APISO): 48 U/L (ref 35–144)
BUN/Creatinine Ratio: 22 (calc) (ref 6–22)
BUN: 35 mg/dL — ABNORMAL HIGH (ref 7–25)
CO2: 24 mmol/L (ref 20–32)
Calcium: 10.3 mg/dL (ref 8.6–10.3)
Chloride: 107 mmol/L (ref 98–110)
Creat: 1.59 mg/dL — ABNORMAL HIGH (ref 0.70–1.18)
GFR, Est African American: 50 mL/min/{1.73_m2} — ABNORMAL LOW (ref >=60)
GFR, Est Non African American: 43 mL/min/{1.73_m2} — ABNORMAL LOW (ref >=60)
Globulin: 3.2 g/dL (calc) (ref 1.9–3.7)
Glucose, Bld: 118 mg/dL — ABNORMAL HIGH (ref 65–99)
Potassium: 4.4 mmol/L (ref 3.5–5.3)
Sodium: 138 mmol/L (ref 135–146)
Total Bilirubin: 0.6 mg/dL (ref 0.2–1.2)
Total Protein: 8.1 g/dL (ref 6.1–8.1)

## 2019-10-28 LAB — LIPID PANEL
Cholesterol: 103 mg/dL (ref <200)
HDL: 34 mg/dL — ABNORMAL LOW (ref >=40)
LDL Cholesterol (Calc): 49 mg/dL (calc)
Non-HDL Cholesterol (Calc): 69 mg/dL (calc) (ref <130)
Total CHOL/HDL Ratio: 3 (calc) (ref <5.0)
Triglycerides: 122 mg/dL (ref <150)

## 2019-10-28 LAB — MICROALBUMIN / CREATININE URINE RATIO
Creatinine, Urine: 230 mg/dL (ref 20–320)
Microalb Creat Ratio: 6 mcg/mg creat (ref <30)
Microalb, Ur: 1.3 mg/dL

## 2019-10-28 LAB — TSH: TSH: 0.48 mIU/L (ref 0.40–4.50)

## 2019-10-28 LAB — T4, FREE: Free T4: 1.6 ng/dL (ref 0.8–1.8)

## 2019-11-03 ENCOUNTER — Encounter: Payer: Self-pay | Admitting: Nurse Practitioner

## 2019-11-03 ENCOUNTER — Ambulatory Visit (INDEPENDENT_AMBULATORY_CARE_PROVIDER_SITE_OTHER): Payer: BC Managed Care – PPO | Admitting: Nurse Practitioner

## 2019-11-03 ENCOUNTER — Other Ambulatory Visit: Payer: Self-pay

## 2019-11-03 VITALS — BP 185/82 | HR 66 | Ht 73.0 in | Wt 207.6 lb

## 2019-11-03 DIAGNOSIS — E119 Type 2 diabetes mellitus without complications: Secondary | ICD-10-CM

## 2019-11-03 DIAGNOSIS — E890 Postprocedural hypothyroidism: Secondary | ICD-10-CM | POA: Diagnosis not present

## 2019-11-03 DIAGNOSIS — I10 Essential (primary) hypertension: Secondary | ICD-10-CM

## 2019-11-03 DIAGNOSIS — E785 Hyperlipidemia, unspecified: Secondary | ICD-10-CM | POA: Diagnosis not present

## 2019-11-03 LAB — POCT GLYCOSYLATED HEMOGLOBIN (HGB A1C): Hemoglobin A1C: 7.2 % — AB (ref 4.0–5.6)

## 2019-11-03 NOTE — Patient Instructions (Signed)

## 2019-11-03 NOTE — Progress Notes (Addendum)
11/03/2019  Endocrinology follow-up note   Subjective:    Patient ID: Jacob Lowe, male    DOB: 11/17/1945,   Past Medical History:  Diagnosis Date  . Diabetes mellitus   . Hypertension   . Hypertension   . Hyperthyroidism    Resolved  after therapy with I-131  . Hypothyroidism    Past Surgical History:  Procedure Laterality Date  . COLONOSCOPY N/A 06/11/2017   Procedure: COLONOSCOPY;  Surgeon: Malissa Hippo, MD;  Location: [facility];  Service: Endoscopy;  Laterality: N/A;  830   Social History   Socioeconomic History  . Marital status: Married    Spouse name: Not on file  . Number of children: Not on file  . Years of education: Not on file  . Highest education level: Not on file  Occupational History  . Not on file  Tobacco Use  . Smoking status: Former Games developer  . Smokeless tobacco: Never Used  Vaping Use  . Vaping Use: Never used  Substance and Sexual Activity  . Alcohol use: Yes    Alcohol/week: 0.0 standard drinks    Comment: one to two beer per day  . Drug use: No  . Sexual activity: Not on file  Other Topics Concern  . Not on file  Social History Narrative  . Not on file   Social Determinants of Health   Financial Resource Strain:   . Difficulty of Paying Living Expenses: Not on file  Food Insecurity:   . Worried About Programme researcher, broadcasting/film/video in the Last Year: Not on file  . Ran Out of Food in the Last Year: Not on file  Transportation Needs:   . Lack of Transportation (Medical): Not on file  . Lack of Transportation (Non-Medical): Not on file  Physical Activity:   . Days of Exercise per Week: Not on file  . Minutes of Exercise per Session: Not on file  Stress:   . Feeling of Stress : Not on file  Social Connections:   . Frequency of Communication with Friends and Family: Not on file  . Frequency of Social Gatherings with Friends and Family: Not on file  . Attends Religious Services: Not on file  . Active Member of Clubs or Organizations:  Not on file  . Attends Banker Meetings: Not on file  . Marital Status: Not on file   Outpatient Encounter Medications as of 11/03/2019  Medication Sig  . carvedilol (COREG) 25 MG tablet TAKE 1 TABLET BY MOUTH TWICE A DAY  . cloNIDine (CATAPRES) 0.1 MG tablet TAKE 1 TABLET (0.1 MG TOTAL) BY MOUTH 2 (TWO) TIMES DAILY.  Marland Kitchen levothyroxine (SYNTHROID) 100 MCG tablet TAKE 1 TABLET BY MOUTH EVERY DAY BEFORE BREAKFAST  . metFORMIN (GLUCOPHAGE) 500 MG tablet TAKE 1 TABLET BY MOUTH TWICE A DAY WITH MEALS  . olmesartan-hydrochlorothiazide (BENICAR HCT) 40-25 MG tablet TAKE 1 TABLET BY MOUTH EVERY DAY  . rosuvastatin (CRESTOR) 10 MG tablet Take 1 tablet (10 mg total) by mouth daily.   No facility-administered encounter medications on file as of 11/03/2019.   ALLERGIES: No Known Allergies VACCINATION STATUS:  There is no immunization history on file for this patient.  Diabetes Her disease course has been worsening. Associated symptoms include weight loss. Pertinent negatives for diabetes include no blurred vision, no chest pain, no fatigue, no polydipsia, no polyphagia, no polyuria and no weakness. Symptoms are worsening. (He presents today with no meter or logs to review.  He does not monitor  glucose regularly due to being on Metformin only.  His POCT A1C today is 7.2%, increasing from previous visit of 6.4%.  His Metformin was reduced on last visit from 1000 mg BID to 500 mg BID.)  Thyroid Problem Symptoms include weight loss. Patient reports no anxiety, cold intolerance, constipation, depressed mood, diarrhea, fatigue, heat intolerance, leg swelling, palpitations, tremors or weight gain. Her past medical history is significant for hyperlipidemia.  Hyperlipidemia  Hypertension Identifiable causes of hypertension include a thyroid problem.    Review of systems  Constitutional: + Minimally fluctuating body weight,  current Body mass index is 27.39 kg/m. , no fatigue, no subjective  hyperthermia, no subjective hypothermia Eyes: no blurry vision, no xerophthalmia ENT: no sore throat, no nodules palpated in throat, no dysphagia/odynophagia, no hoarseness Cardiovascular: no chest pain, no shortness of breath, no palpitations, no leg swelling Respiratory: no cough, no shortness of breath Gastrointestinal: no nausea/vomiting/diarrhea Musculoskeletal: no muscle/joint aches Skin: no rashes, no hyperemia Neurological: no tremors, no numbness, no tingling, no dizziness Psychiatric: no depression, no anxiety   Objective:    BP (!) 185/82 (BP Location: Left Arm, Patient Position: Sitting)   Pulse 66   Ht 6\' 1"  (1.854 m)   Wt 207 lb 9.6 oz (94.2 kg)   BMI 27.39 kg/m   Wt Readings from Last 3 Encounters:  11/03/19 207 lb 9.6 oz (94.2 kg)  04/29/19 208 lb 6.4 oz (94.5 kg)  06/23/18 213 lb (96.6 kg)    BP Readings from Last 3 Encounters:  11/03/19 (!) 185/82  04/29/19 (!) 181/89  06/23/18 (!) 166/80    Physical Exam- Limited  Constitutional:  Body mass index is 27.39 kg/m. , not in acute distress, mildly anxious state of mind Eyes:  EOMI, no exophthalmos Neck: Supple Thyroid: No gross goiter Cardiovascular: RRR, no murmers, rubs, or gallops, no edema Respiratory: Adequate breathing efforts, no crackles, rales, rhonchi, or wheezing Musculoskeletal: no gross deformities, strength intact in all four extremities, no gross restriction of joint movements Skin:  no rashes, no hyperemia Neurological: no tremor with outstretched hands   Recent Results (from the past 2160 hour(s))  COMPLETE METABOLIC PANEL WITH GFR     Status: Abnormal   Collection Time: 10/27/19 10:56 AM  Result Value Ref Range   Glucose, Bld 118 (H) 65 - 99 mg/dL    Comment: .            Fasting reference interval . For someone without known diabetes, a glucose value between 100 and 125 mg/dL is consistent with prediabetes and should be confirmed with a follow-up test. .    BUN 35 (H) 7 - 25  mg/dL   Creat 1.611.59 (H) 0.960.70 - 1.18 mg/dL    Comment: For patients >72 years of age, the reference limit for Creatinine is approximately 13% higher for people identified as African-American. .    GFR, Est Non African American 43 (L) > OR = 60 mL/min/1.5173m2   GFR, Est African American 50 (L) > OR = 60 mL/min/1.1173m2   BUN/Creatinine Ratio 22 6 - 22 (calc)   Sodium 138 135 - 146 mmol/L   Potassium 4.4 3.5 - 5.3 mmol/L   Chloride 107 98 - 110 mmol/L   CO2 24 20 - 32 mmol/L   Calcium 10.3 8.6 - 10.3 mg/dL   Total Protein 8.1 6.1 - 8.1 g/dL   Albumin 4.9 3.6 - 5.1 g/dL   Globulin 3.2 1.9 - 3.7 g/dL (calc)   AG Ratio 1.5 1.0 - 2.5 (  calc)   Total Bilirubin 0.6 0.2 - 1.2 mg/dL   Alkaline phosphatase (APISO) 48 35 - 144 U/L   AST 15 10 - 35 U/L   ALT 12 9 - 46 U/L  Lipid panel     Status: Abnormal   Collection Time: 10/27/19 10:56 AM  Result Value Ref Range   Cholesterol 103 <200 mg/dL   HDL 34 (L) > OR = 40 mg/dL   Triglycerides 248 <250 mg/dL   LDL Cholesterol (Calc) 49 mg/dL (calc)    Comment: Reference range: <100 . Desirable range <100 mg/dL for primary prevention;   <70 mg/dL for patients with CHD or diabetic patients  with > or = 2 CHD risk factors. Marland Kitchen LDL-C is now calculated using the Martin-Hopkins  calculation, which is a validated novel method providing  better accuracy than the Friedewald equation in the  estimation of LDL-C.  Horald Pollen et al. Lenox Ahr. 0370;488(89): 2061-2068  (http://education.[facility].com/faq/FAQ164)    Total CHOL/HDL Ratio 3.0 <5.0 (calc)   Non-HDL Cholesterol (Calc) 69 <169 mg/dL (calc)    Comment: For patients with diabetes plus 1 major ASCVD risk  factor, treating to a non-HDL-C goal of <100 mg/dL  (LDL-C of <45 mg/dL) is considered a therapeutic  option.   TSH     Status: None   Collection Time: 10/27/19 10:56 AM  Result Value Ref Range   TSH 0.48 0.40 - 4.50 mIU/L  T4, free     Status: None   Collection Time: 10/27/19 10:56 AM   Result Value Ref Range   Free T4 1.6 0.8 - 1.8 ng/dL  Microalbumin / creatinine urine ratio     Status: None   Collection Time: 10/27/19 10:56 AM  Result Value Ref Range   Creatinine, Urine 230 20 - 320 mg/dL   Microalb, Ur 1.3 mg/dL    Comment: Reference Range Not established    Microalb Creat Ratio 6 <30 mcg/mg creat    Comment: . The ADA defines abnormalities in albumin excretion as follows: Marland Kitchen Albuminuria Category        Result (mcg/mg creatinine) . Normal to Mildly increased   <30 Moderately increased         30-299  Severely increased           > OR = 300 . The ADA recommends that at least two of three specimens collected within a 3-6 month period be abnormal before considering a patient to be within a diagnostic category.   HgB A1c     Status: Abnormal   Collection Time: 11/03/19  1:51 PM  Result Value Ref Range   Hemoglobin A1C 7.2 (A) 4.0 - 5.6 %   HbA1c POC (<> result, manual entry)     HbA1c, POC (prediabetic range)     HbA1c, POC (controlled diabetic range)     Recent A1c was 6.8%.  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  He is advised to continue Levothyroxine 100 mcg po daily before breakfast, a suitable dose for this season.      - We discussed about the correct intake of his thyroid hormone, on empty stomach at fasting, with water, separated by at least 30 minutes from breakfast and other medications,  and separated by more than 4 hours from calcium, iron, multivitamins, acid reflux medications (PPIs). -Patient is made aware of the fact that thyroid hormone replacement is needed for  life, dose to be adjusted by periodic monitoring of thyroid function tests.  2.  Hypertension: -His blood pressure is not controlled to target.  His BP is always elevated at visits, reports some white coat syndrome.  He reports BP is better when he  monitors it at home.  He is advised to continue Coreg 25 mg po daily, Clonidine 0.1 mg po BID, and Benicar- HCT 40-25 mg po daily.  3. Diabetes mellitus without complication (HCC) -He presents today with no meter or logs to review.  He does not monitor glucose regularly due to being on Metformin only.  His POCT A1C today is 7.2%, increasing from previous visit of 6.4%.  His Metformin was reduced on last visit from 1000 mg BID to 500 mg BID due to declining renal function.  - Nutritional counseling repeated at each appointment due to patients tendency to fall back in to old habits.  - The patient admits there is a room for improvement in their diet and drink choices. -  Suggestion is made for the patient to avoid simple carbohydrates from their diet including Cakes, Sweet Desserts / Pastries, Ice Cream, Soda (diet and regular), Sweet Tea, Candies, Chips, Cookies, Sweet Pastries,  Store Bought Juices, Alcohol in Excess of  1-2 drinks a day, Artificial Sweeteners, Coffee Creamer, and "Sugar-free" Products. This will help patient to have stable blood glucose profile and potentially avoid unintended weight gain.   - I encouraged the patient to switch to  unprocessed or minimally processed complex starch and increased protein intake (animal or plant source), fruits, and vegetables.   - Patient is advised to stick to a routine mealtimes to eat 3 meals  a day and avoid unnecessary snacks ( to snack only to correct hypoglycemia).  -He is advised to continue Metformin 500 mg PO twice daily with meals, a suitable dose for him. He would like to work on diet and exercise for now.  He wants to avoid having to start another medication at this time. Will recheck CMP prior to next visit to monitor renal function.  4. Hyperlipidemia:  His lipid panel from 10/27/19 shows controlled LDL of 49.  He is advised to continue Crestor 10 mg po daily at bedtime.  Side effects and precautions discussed with him.   - Time  spent on this patient care encounter:  35 min, of which > 50% was spent in  counseling and the rest reviewing his blood glucose logs , discussing his hypoglycemia and hyperglycemia episodes, reviewing his current and  previous labs / studies  ( including abstraction from other facilities) and medications  doses and developing a  long term treatment plan and documenting his care.   Please refer to Patient Instructions for Blood Glucose Monitoring and Insulin/Medications Dosing Guide"  in media tab for additional information. Please  also refer to " Patient Self Inventory" in the Media  tab for reviewed elements of pertinent patient history.  Jacob Lowe participated in the discussions, expressed understanding, and voiced agreement with the above plans.  All questions were answered to his satisfaction. he is encouraged to contact clinic should he have any questions or concerns prior to his return visit.    Follow up plan: Return in about 4 months (around 03/05/2020) for Diabetes follow up with A1c in office, Previsit labs, Thyroid follow up.  Ronny Bacon, [facility] [address] [place], [place] [address] Phone: [phone] Fax: [phone]  11/03/2019, 2:23 PM

## 2019-12-05 ENCOUNTER — Other Ambulatory Visit: Payer: Self-pay | Admitting: "Endocrinology

## 2020-01-20 ENCOUNTER — Other Ambulatory Visit: Payer: Self-pay | Admitting: "Endocrinology

## 2020-02-03 ENCOUNTER — Other Ambulatory Visit: Payer: Self-pay | Admitting: "Endocrinology

## 2020-02-03 NOTE — Telephone Encounter (Signed)
Pt's last office note states carvedilol 25mg  daily but his medication list shows Rx for twice a day. Please advise.

## 2020-02-09 ENCOUNTER — Other Ambulatory Visit: Payer: Self-pay | Admitting: "Endocrinology

## 2020-03-03 ENCOUNTER — Other Ambulatory Visit: Payer: Self-pay | Admitting: "Endocrinology

## 2020-03-04 LAB — COMPLETE METABOLIC PANEL WITH GFR
AG Ratio: 1.5 (calc) (ref 1.0–2.5)
ALT: 13 U/L (ref 9–46)
AST: 14 U/L (ref 10–35)
Albumin: 4.8 g/dL (ref 3.6–5.1)
Alkaline phosphatase (APISO): 51 U/L (ref 35–144)
BUN/Creatinine Ratio: 20 (calc) (ref 6–22)
BUN: 27 mg/dL — ABNORMAL HIGH (ref 7–25)
CO2: 25 mmol/L (ref 20–32)
Calcium: 10.2 mg/dL (ref 8.6–10.3)
Chloride: 106 mmol/L (ref 98–110)
Creat: 1.32 mg/dL — ABNORMAL HIGH (ref 0.70–1.18)
GFR, Est African American: 62 mL/min/{1.73_m2} (ref >=60)
GFR, Est Non African American: 53 mL/min/{1.73_m2} — ABNORMAL LOW (ref >=60)
Globulin: 3.1 g/dL (calc) (ref 1.9–3.7)
Glucose, Bld: 135 mg/dL — ABNORMAL HIGH (ref 65–99)
Potassium: 4.8 mmol/L (ref 3.5–5.3)
Sodium: 140 mmol/L (ref 135–146)
Total Bilirubin: 0.3 mg/dL (ref 0.2–1.2)
Total Protein: 7.9 g/dL (ref 6.1–8.1)

## 2020-03-04 LAB — T4, FREE: Free T4: 1.6 ng/dL (ref 0.8–1.8)

## 2020-03-04 LAB — TSH: TSH: 1.32 mIU/L (ref 0.40–4.50)

## 2020-03-09 ENCOUNTER — Other Ambulatory Visit: Payer: Self-pay

## 2020-03-09 ENCOUNTER — Encounter: Payer: Self-pay | Admitting: Nurse Practitioner

## 2020-03-09 ENCOUNTER — Ambulatory Visit (INDEPENDENT_AMBULATORY_CARE_PROVIDER_SITE_OTHER): Payer: BC Managed Care – PPO | Admitting: Nurse Practitioner

## 2020-03-09 VITALS — BP 181/87 | HR 58 | Ht 73.0 in | Wt 210.0 lb

## 2020-03-09 DIAGNOSIS — E890 Postprocedural hypothyroidism: Secondary | ICD-10-CM | POA: Diagnosis not present

## 2020-03-09 DIAGNOSIS — I10 Essential (primary) hypertension: Secondary | ICD-10-CM

## 2020-03-09 DIAGNOSIS — E785 Hyperlipidemia, unspecified: Secondary | ICD-10-CM

## 2020-03-09 DIAGNOSIS — E119 Type 2 diabetes mellitus without complications: Secondary | ICD-10-CM

## 2020-03-09 DIAGNOSIS — E559 Vitamin D deficiency, unspecified: Secondary | ICD-10-CM

## 2020-03-09 LAB — POCT GLYCOSYLATED HEMOGLOBIN (HGB A1C): HbA1c, POC (controlled diabetic range): 6.9 % (ref 0.0–7.0)

## 2020-03-09 NOTE — Progress Notes (Signed)
03/09/2020  Endocrinology follow-up note   Subjective:    Patient ID: Jacob Lowe, male    DOB: 1945/03/14,   Past Medical History:  Diagnosis Date  . Diabetes mellitus   . Hypertension   . Hypertension   . Hyperthyroidism    Resolved  after therapy with [id number]  . Hypothyroidism    Past Surgical History:  Procedure Laterality Date  . COLONOSCOPY N/A 06/11/2017   Procedure: COLONOSCOPY;  Surgeon: Malissa Hippo, MD;  Location: [facility];  Service: Endoscopy;  Laterality: N/A;  830   Social History   Socioeconomic History  . Marital status: Married    Spouse name: Not on file  . Number of children: Not on file  . Years of education: Not on file  . Highest education level: Not on file  Occupational History  . Not on file  Tobacco Use  . Smoking status: Former Games developer  . Smokeless tobacco: Never Used  Vaping Use  . Vaping Use: Never used  Substance and Sexual Activity  . Alcohol use: Yes    Alcohol/week: 0.0 standard drinks    Comment: one to two beer per day  . Drug use: No  . Sexual activity: Not on file  Other Topics Concern  . Not on file  Social History Narrative  . Not on file   Social Determinants of Health   Financial Resource Strain: Not on file  Food Insecurity: Not on file  Transportation Needs: Not on file  Physical Activity: Not on file  Stress: Not on file  Social Connections: Not on file   Outpatient Encounter Medications as of 03/09/2020  Medication Sig  . carvedilol (COREG) 25 MG tablet TAKE 1 TABLET BY MOUTH TWICE A DAY  . cloNIDine (CATAPRES) 0.1 MG tablet TAKE 1 TABLET (0.1 MG TOTAL) BY MOUTH 2 (TWO) TIMES DAILY.  Marland Kitchen levothyroxine (SYNTHROID) 100 MCG tablet TAKE 1 TABLET BY MOUTH EVERY DAY BEFORE BREAKFAST  . metFORMIN (GLUCOPHAGE) 500 MG tablet TAKE 1 TABLET BY MOUTH TWICE A DAY WITH MEALS  . olmesartan-hydrochlorothiazide (BENICAR HCT) 40-25 MG tablet TAKE 1 TABLET BY MOUTH EVERY DAY  . rosuvastatin (CRESTOR) 10 MG tablet Take 1  tablet (10 mg total) by mouth daily.   No facility-administered encounter medications on file as of 03/09/2020.   ALLERGIES: No Known Allergies VACCINATION STATUS:  There is no immunization history on file for this patient.  Diabetes He presents for his follow-up diabetic visit. He has type 2 diabetes mellitus. His disease course has been improving. There are no hypoglycemic associated symptoms. Pertinent negatives for hypoglycemia include no nervousness/anxiousness or tremors. Pertinent negatives for diabetes include no blurred vision, no chest pain, no fatigue, no polydipsia, no polyphagia, no polyuria, no weakness and no weight loss. There are no hypoglycemic complications. Symptoms are improving. Diabetic complications include nephropathy. Risk factors for coronary artery disease include diabetes mellitus, dyslipidemia, hypertension, male sex and sedentary lifestyle. Current diabetic treatment includes oral agent (monotherapy). He is compliant with treatment most of the time. His weight is fluctuating minimally. He is following a generally unhealthy diet. When asked about meal planning, he reported none. He has not had a previous visit with a dietitian. He rarely participates in exercise. (He presents today with no meter or logs to review.  He does not monitor glucose regularly due to being on Metformin only.  His POCT A1c today is 6.9%, improving from last visit of 7.2%.  ) An ACE inhibitor/angiotensin II receptor blocker is being taken.  He does not see a podiatrist.Eye exam is current.  Thyroid Problem Presents for follow-up (75 yr old patient with Graves' disease status post radioactive iodine ablation of the thyroid on June 15, 2011.  ) visit. Patient reports no anxiety, cold intolerance, constipation, depressed mood, diarrhea, fatigue, heat intolerance, leg swelling, palpitations, tremors, weight gain or weight loss. The symptoms have been stable.  Hypertension This is a chronic problem. The  current episode started more than 1 year ago. The problem is unchanged. The problem is uncontrolled. Pertinent negatives include no blurred vision, chest pain or palpitations. Agents associated with hypertension include thyroid hormones. Risk factors for coronary artery disease include diabetes mellitus, dyslipidemia, male gender and sedentary lifestyle. Past treatments include diuretics, central alpha agonists, angiotensin blockers and beta blockers. The current treatment provides mild improvement. There are no compliance problems.  Hypertensive end-organ damage includes kidney disease. Identifiable causes of hypertension include chronic renal disease and a thyroid problem.    Review of systems  Constitutional: + Minimally fluctuating body weight,  current Body mass index is 27.71 kg/m. , no fatigue, no subjective hyperthermia, no subjective hypothermia Eyes: no blurry vision, no xerophthalmia ENT: no sore throat, no nodules palpated in throat, no dysphagia/odynophagia, no hoarseness Cardiovascular: no chest pain, no shortness of breath, no palpitations, no leg swelling Respiratory: no cough, no shortness of breath Gastrointestinal: no nausea/vomiting/diarrhea Musculoskeletal: no muscle/joint aches Skin: no rashes, no hyperemia Neurological: no tremors, no numbness, no tingling, no dizziness Psychiatric: no depression, no anxiety   Objective:    BP (!) 181/87 (BP Location: Right Arm)   Pulse (!) 58   Ht 6\' 1"  (1.854 m)   Wt 210 lb (95.3 kg)   BMI 27.71 kg/m   Wt Readings from Last 3 Encounters:  03/09/20 210 lb (95.3 kg)  11/03/19 207 lb 9.6 oz (94.2 kg)  04/29/19 208 lb 6.4 oz (94.5 kg)    BP Readings from Last 3 Encounters:  03/09/20 (!) 181/87  11/03/19 (!) 185/82  04/29/19 (!) 181/89     Physical Exam- Limited  Constitutional:  Body mass index is 27.71 kg/m. , not in acute distress, normal state of mind Eyes:  EOMI, no exophthalmos Neck: Supple Thyroid: No gross  goiter Cardiovascular: RRR, no murmers, rubs, or gallops, no edema Respiratory: Adequate breathing efforts, no crackles, rales, rhonchi, or wheezing Musculoskeletal: no gross deformities, strength intact in all four extremities, no gross restriction of joint movements Skin:  no rashes, no hyperemia Neurological: no tremor with outstretched hands  Foot exam:   No rashes, ulcers, cuts, calluses, onychodystrophy.   Good pulses bilat.  Good sensation to 10 g monofilament bilat.   POCT ABI Results 03/09/20   Right ABI:  1.12      Left ABI:  1.13  Right leg systolic / diastolic: 202/93 mmHg Left leg systolic / diastolic: 204/87 mmHg  Arm systolic / diastolic: 181/87 mmHG  Detailed report will be scanned into patient chart.    Recent Results (from the past 2160 hour(s))  COMPLETE METABOLIC PANEL WITH GFR     Status: Abnormal   Collection Time: 03/03/20  7:23 AM  Result Value Ref Range   Glucose, Bld 135 (H) 65 - 99 mg/dL    Comment: .            Fasting reference interval . For someone without known diabetes, a glucose value >125 mg/dL indicates that they may have diabetes and this should be confirmed with a follow-up test. .  BUN 27 (H) 7 - 25 mg/dL   Creat 2.22 (H) 9.79 - 1.18 mg/dL    Comment: For patients >35 years of age, the reference limit for Creatinine is approximately 13% higher for people identified as African-American. .    GFR, Est Non African American 53 (L) > OR = 60 mL/min/1.56m2   GFR, Est African American 62 > OR = 60 mL/min/1.39m2   BUN/Creatinine Ratio 20 6 - 22 (calc)   Sodium 140 135 - 146 mmol/L   Potassium 4.8 3.5 - 5.3 mmol/L   Chloride 106 98 - 110 mmol/L   CO2 25 20 - 32 mmol/L   Calcium 10.2 8.6 - 10.3 mg/dL   Total Protein 7.9 6.1 - 8.1 g/dL   Albumin 4.8 3.6 - 5.1 g/dL   Globulin 3.1 1.9 - 3.7 g/dL (calc)   AG Ratio 1.5 1.0 - 2.5 (calc)   Total Bilirubin 0.3 0.2 - 1.2 mg/dL   Alkaline phosphatase (APISO) 51 35 - 144 U/L   AST 14  10 - 35 U/L   ALT 13 9 - 46 U/L  TSH     Status: None   Collection Time: 03/03/20  7:23 AM  Result Value Ref Range   TSH 1.32 0.40 - 4.50 mIU/L  T4, free     Status: None   Collection Time: 03/03/20  7:23 AM  Result Value Ref Range   Free T4 1.6 0.8 - 1.8 ng/dL  HgB G9Q     Status: Abnormal   Collection Time: 03/09/20  3:59 PM  Result Value Ref Range   Hemoglobin A1C     HbA1c POC (<> result, manual entry)     HbA1c, POC (prediabetic range)     HbA1c, POC (controlled diabetic range) 6.9 0.0 - 7.0 %   Recent A1c was 6.8%.  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  He is advised to continue Levothyroxine 100 mcg po daily before breakfast.   - We discussed about the correct intake of his thyroid hormone, on empty stomach at fasting, with water, separated by at least 30 minutes from breakfast and other medications,  and separated by more than 4 hours from calcium, iron, multivitamins, acid reflux medications (PPIs). -Patient is made aware of the fact that thyroid hormone replacement is needed for life, dose to be adjusted by periodic monitoring of thyroid function tests.  2.  Hypertension: -His blood pressure is not controlled to target again today.  His BP is always elevated at visits, reports some white coat syndrome.  He reports BP is better when he monitors it at home.  He is advised to continue Coreg 25 mg po daily, Clonidine 0.1 mg po BID, and Benicar- HCT 40-25 mg po daily.  3. Diabetes mellitus without complication (HCC)  -He presents today with no meter or logs to review.  He does not monitor glucose regularly due to being on Metformin only.  His POCT A1c today is 6.9%, improving from last visit of 7.2%.    - Nutritional counseling repeated at each appointment due to patients tendency to fall back in to old habits.  - The patient admits there is a room for improvement in their  diet and drink choices. -  Suggestion is made for the patient to avoid simple carbohydrates from their diet including Cakes, Sweet Desserts / Pastries, Ice Cream, Soda (diet and regular), Sweet Tea, Candies, Chips, Cookies, Sweet Pastries,  Store Bought Juices, Alcohol in Excess of  1-2 drinks a day, Artificial Sweeteners, Coffee Creamer, and "Sugar-free" Products. This will help patient to have stable blood glucose profile and potentially avoid unintended weight gain.   - I encouraged the patient to switch to  unprocessed or minimally processed complex starch and increased protein intake (animal or plant source), fruits, and vegetables.   - Patient is advised to stick to a routine mealtimes to eat 3 meals  a day and avoid unnecessary snacks ( to snack only to correct hypoglycemia).  -Given his stable, and improved glycemic profile, he is advised to continue Metformin 500 mg PO twice daily with meals, a suitable dose for him. He would like to work on diet and exercise for now.   4. Hyperlipidemia:  His lipid panel from 10/27/19 shows controlled LDL of 49.  He is advised to continue Crestor 10 mg po daily at bedtime.  Side effects and precautions discussed with him.   - Time spent on this patient care encounter:  40 min, of which > 50% was spent in  counseling and the rest reviewing his blood glucose logs , discussing his hypoglycemia and hyperglycemia episodes, reviewing his current and  previous labs / studies  ( including abstraction from other facilities) and medications  doses and developing a  long term treatment plan and documenting his care.   Please refer to Patient Instructions for Blood Glucose Monitoring and Insulin/Medications Dosing Guide"  in media tab for additional information. Please  also refer to " Patient Self Inventory" in the Media  tab for reviewed elements of pertinent patient history.  Jacob Lucksavid Briones participated in the discussions, expressed understanding, and voiced agreement  with the above plans.  All questions were answered to his satisfaction. he is encouraged to contact clinic should he have any questions or concerns prior to his return visit.    Follow up plan: Return in about 6 months (around 09/09/2020) for Diabetes follow up with A1c in office, Thyroid follow up, Previsit labs.  Ronny BaconWhitney Reardon, Memorial Hermann Katy HospitalFNP-BC [facility] [address] [place], [place] [address] Phone: [phone] Fax: [phone]  03/09/2020, 4:09 PM

## 2020-03-09 NOTE — Patient Instructions (Signed)

## 2020-04-12 ENCOUNTER — Other Ambulatory Visit: Payer: Self-pay | Admitting: "Endocrinology

## 2020-04-21 ENCOUNTER — Other Ambulatory Visit: Payer: Self-pay | Admitting: "Endocrinology

## 2020-04-29 ENCOUNTER — Other Ambulatory Visit: Payer: Self-pay | Admitting: "Endocrinology

## 2020-05-01 NOTE — Telephone Encounter (Signed)
Yes, ill refill it since it was originally prescribed by Dr Fransico Him

## 2020-05-01 NOTE — Telephone Encounter (Signed)
Do you want to order this

## 2020-05-05 ENCOUNTER — Other Ambulatory Visit: Payer: Self-pay | Admitting: Nurse Practitioner

## 2020-05-29 ENCOUNTER — Other Ambulatory Visit: Payer: Self-pay | Admitting: "Endocrinology

## 2020-07-09 ENCOUNTER — Other Ambulatory Visit: Payer: Self-pay | Admitting: Nurse Practitioner

## 2020-07-19 ENCOUNTER — Other Ambulatory Visit: Payer: Self-pay | Admitting: "Endocrinology

## 2020-07-26 ENCOUNTER — Other Ambulatory Visit: Payer: Self-pay | Admitting: Nurse Practitioner

## 2020-07-26 NOTE — Telephone Encounter (Signed)
Pt's last note states for him to continue carvedilol 25mg  daily but his last Rx was for twice a day. Just wanted to clarify.

## 2020-07-26 NOTE — Telephone Encounter (Signed)
Its twice daily... my bad

## 2020-07-30 ENCOUNTER — Other Ambulatory Visit: Payer: Self-pay | Admitting: Nurse Practitioner

## 2020-09-04 ENCOUNTER — Other Ambulatory Visit: Payer: Self-pay | Admitting: Nurse Practitioner

## 2020-09-05 LAB — COMPLETE METABOLIC PANEL WITH GFR
AG Ratio: 1.6 (calc) (ref 1.0–2.5)
ALT: 12 U/L (ref 9–46)
AST: 12 U/L (ref 10–35)
Albumin: 4.6 g/dL (ref 3.6–5.1)
Alkaline phosphatase (APISO): 53 U/L (ref 35–144)
BUN: 24 mg/dL (ref 7–25)
CO2: 27 mmol/L (ref 20–32)
Calcium: 10.1 mg/dL (ref 8.6–10.3)
Chloride: 105 mmol/L (ref 98–110)
Creat: 1.25 mg/dL (ref 0.70–1.28)
Globulin: 2.9 g/dL (calc) (ref 1.9–3.7)
Glucose, Bld: 167 mg/dL — ABNORMAL HIGH (ref 65–99)
Potassium: 4.8 mmol/L (ref 3.5–5.3)
Sodium: 139 mmol/L (ref 135–146)
Total Bilirubin: 0.4 mg/dL (ref 0.2–1.2)
Total Protein: 7.5 g/dL (ref 6.1–8.1)
eGFR: 61 mL/min/{1.73_m2} (ref >=60)

## 2020-09-05 LAB — T4, FREE: Free T4: 1.4 ng/dL (ref 0.8–1.8)

## 2020-09-05 LAB — TSH: TSH: 1.07 mIU/L (ref 0.40–4.50)

## 2020-09-05 LAB — VITAMIN D 25 HYDROXY (VIT D DEFICIENCY, FRACTURES): Vit D, 25-Hydroxy: 18 ng/mL — ABNORMAL LOW (ref 30–100)

## 2020-09-12 ENCOUNTER — Ambulatory Visit: Payer: BC Managed Care – PPO | Admitting: Nurse Practitioner

## 2020-09-12 ENCOUNTER — Encounter: Payer: Self-pay | Admitting: Nurse Practitioner

## 2020-09-12 ENCOUNTER — Other Ambulatory Visit: Payer: Self-pay

## 2020-09-12 VITALS — BP 185/78 | HR 66 | Ht 73.0 in | Wt 216.2 lb

## 2020-09-12 DIAGNOSIS — I10 Essential (primary) hypertension: Secondary | ICD-10-CM | POA: Diagnosis not present

## 2020-09-12 DIAGNOSIS — E785 Hyperlipidemia, unspecified: Secondary | ICD-10-CM

## 2020-09-12 DIAGNOSIS — E559 Vitamin D deficiency, unspecified: Secondary | ICD-10-CM

## 2020-09-12 DIAGNOSIS — E119 Type 2 diabetes mellitus without complications: Secondary | ICD-10-CM

## 2020-09-12 DIAGNOSIS — E890 Postprocedural hypothyroidism: Secondary | ICD-10-CM

## 2020-09-12 LAB — POCT UA - MICROALBUMIN
Albumin/Creatinine Ratio, Urine, POC: <30
Creatinine, POC: 300 mg/dL
Microalbumin Ur, POC: 30 mg/L

## 2020-09-12 LAB — POCT GLYCOSYLATED HEMOGLOBIN (HGB A1C): Hemoglobin A1C: 8.2 % — AB (ref 4.0–5.6)

## 2020-09-12 MED ORDER — VITAMIN D (ERGOCALCIFEROL) 1.25 MG (50000 UNIT) PO CAPS: 50000.0000 [IU] | ORAL_CAPSULE | ORAL | 0 refills | Status: DC

## 2020-09-12 NOTE — Patient Instructions (Signed)
Diabetes Mellitus and Nutrition, Adult When you have diabetes, or diabetes mellitus, it is very important to have healthy eating habits because your blood sugar (glucose) levels are greatly affected by what you eat and drink. Eating healthy foods in the right amounts, at about the same times every day, can help you:  Control your blood glucose.  Lower your risk of heart disease.  Improve your blood pressure.  Reach or maintain a healthy weight. What can affect my meal plan? Every person with diabetes is different, and each person has different needs for a meal plan. Your health care provider may recommend that you work with a dietitian to make a meal plan that is best for you. Your meal plan may vary depending on factors such as:  The calories you need.  The medicines you take.  Your weight.  Your blood glucose, blood pressure, and cholesterol levels.  Your activity level.  Other health conditions you have, such as heart or kidney disease. How do carbohydrates affect me? Carbohydrates, also called carbs, affect your blood glucose level more than any other type of food. Eating carbs naturally raises the amount of glucose in your blood. Carb counting is a method for keeping track of how many carbs you eat. Counting carbs is important to keep your blood glucose at a healthy level, especially if you use insulin or take certain oral diabetes medicines. It is important to know how many carbs you can safely have in each meal. This is different for every person. Your dietitian can help you calculate how many carbs you should have at each meal and for each snack. How does alcohol affect me? Alcohol can cause a sudden decrease in blood glucose (hypoglycemia), especially if you use insulin or take certain oral diabetes medicines. Hypoglycemia can be a life-threatening condition. Symptoms of hypoglycemia, such as sleepiness, dizziness, and confusion, are similar to symptoms of having too much  alcohol.  Do not drink alcohol if: ? Your health care provider tells you not to drink. ? You are pregnant, may be pregnant, or are planning to become pregnant.  If you drink alcohol: ? Do not drink on an empty stomach. ? Limit how much you use to:  0-1 drink a day for women.  0-2 drinks a day for men. ? Be aware of how much alcohol is in your drink. In the [place] one drink equals one 12 oz bottle of beer (355 mL), one 5 oz glass of wine (148 mL), or one 1 oz glass of hard liquor (44 mL). ? Keep yourself hydrated with water, diet soda, or unsweetened iced tea.  Keep in mind that regular soda, juice, and other mixers may contain a lot of sugar and must be counted as carbs. What are tips for following this plan? Reading food labels  Start by checking the serving size on the "Nutrition Facts" label of packaged foods and drinks. The amount of calories, carbs, fats, and other nutrients listed on the label is based on one serving of the item. Many items contain more than one serving per package.  Check the total grams (g) of carbs in one serving. You can calculate the number of servings of carbs in one serving by dividing the total carbs by 15. For example, if a food has 30 g of total carbs per serving, it would be equal to 2 servings of carbs.  Check the number of grams (g) of saturated fats and trans fats in one serving. Choose foods that have   a low amount or none of these fats.  Check the number of milligrams (mg) of salt (sodium) in one serving. Most people should limit total sodium intake to less than 2,300 mg per day.  Always check the nutrition information of foods labeled as "low-fat" or "nonfat." These foods may be higher in added sugar or refined carbs and should be avoided.  Talk to your dietitian to identify your daily goals for nutrients listed on the label. Shopping  Avoid buying canned, pre-made, or processed foods. These foods tend to be high in fat, sodium, and added  sugar.  Shop around the outside edge of the grocery store. This is where you will most often find fresh fruits and vegetables, bulk grains, fresh meats, and fresh dairy. Cooking  Use low-heat cooking methods, such as baking, instead of high-heat cooking methods like deep frying.  Cook using healthy oils, such as olive, canola, or sunflower oil.  Avoid cooking with butter, cream, or high-fat meats. Meal planning  Eat meals and snacks regularly, preferably at the same times every day. Avoid going long periods of time without eating.  Eat foods that are high in fiber, such as fresh fruits, vegetables, beans, and whole grains. Talk with your dietitian about how many servings of carbs you can eat at each meal.  Eat 4-6 oz (112-168 g) of lean protein each day, such as lean meat, chicken, fish, eggs, or tofu. One ounce (oz) of lean protein is equal to: ? 1 oz (28 g) of meat, chicken, or fish. ? 1 egg. ?  cup (62 g) of tofu.  Eat some foods each day that contain healthy fats, such as avocado, nuts, seeds, and fish.   What foods should I eat? Fruits Berries. Apples. Oranges. Peaches. Apricots. Plums. Grapes. Mango. Papaya. Pomegranate. Kiwi. Cherries. Vegetables Lettuce. Spinach. Leafy greens, including kale, chard, collard greens, and mustard greens. Beets. Cauliflower. Cabbage. Broccoli. Carrots. Green beans. Tomatoes. Peppers. Onions. Cucumbers. Brussels sprouts. Grains Whole grains, such as whole-wheat or whole-grain bread, crackers, tortillas, cereal, and pasta. Unsweetened oatmeal. Quinoa. Brown or wild rice. Meats and other proteins Seafood. Poultry without skin. Lean cuts of poultry and beef. Tofu. Nuts. Seeds. Dairy Low-fat or fat-free dairy products such as milk, yogurt, and cheese. The items listed above may not be a complete list of foods and beverages you can eat. Contact a dietitian for more information. What foods should I avoid? Fruits Fruits canned with  syrup. Vegetables Canned vegetables. Frozen vegetables with butter or cream sauce. Grains Refined white flour and flour products such as bread, pasta, snack foods, and cereals. Avoid all processed foods. Meats and other proteins Fatty cuts of meat. Poultry with skin. Breaded or fried meats. Processed meat. Avoid saturated fats. Dairy Full-fat yogurt, cheese, or milk. Beverages Sweetened drinks, such as soda or iced tea. The items listed above may not be a complete list of foods and beverages you should avoid. Contact a dietitian for more information. Questions to ask a health care provider  Do I need to meet with a diabetes educator?  Do I need to meet with a dietitian?  What number can I call if I have questions?  When are the best times to check my blood glucose? Where to find more information:  American Diabetes Association: diabetes.org  Academy of Nutrition and Dietetics: www.eatright.org  National Institute of Diabetes and Digestive and Kidney Diseases: www.niddk.nih.gov  Association of Diabetes Care and Education Specialists: www.diabeteseducator.org Summary  It is important to have healthy eating   habits because your blood sugar (glucose) levels are greatly affected by what you eat and drink.  A healthy meal plan will help you control your blood glucose and maintain a healthy lifestyle.  Your health care provider may recommend that you work with a dietitian to make a meal plan that is best for you.  Keep in mind that carbohydrates (carbs) and alcohol have immediate effects on your blood glucose levels. It is important to count carbs and to use alcohol carefully. This information is not intended to replace advice given to you by your health care provider. Make sure you discuss any questions you have with your health care provider. Document Revised: 12/01/2018 Document Reviewed: 12/01/2018 Elsevier Patient Education  2021 [employer]  

## 2020-09-12 NOTE — Progress Notes (Signed)
09/12/2020  Endocrinology follow-up note   Subjective:    Patient ID: Jacob Lowe, male    DOB: 1945/11/08,   Past Medical History:  Diagnosis Date   Diabetes mellitus    Hypertension    Hypertension    Hyperthyroidism    Resolved  after therapy with I-[id number]   Hypothyroidism    Past Surgical History:  Procedure Laterality Date   COLONOSCOPY N/A 06/11/2017   Procedure: COLONOSCOPY;  Surgeon: Rogene Houston, MD;  Location: [facility];  Service: Endoscopy;  Laterality: N/A;  830   Social History   Socioeconomic History   Marital status: Married    Spouse name: Not on file   Number of children: Not on file   Years of education: Not on file   Highest education level: Not on file  Occupational History   Not on file  Tobacco Use   Smoking status: Former   Smokeless tobacco: Never  Vaping Use   Vaping Use: Never used  Substance and Sexual Activity   Alcohol use: Yes    Alcohol/week: 0.0 standard drinks    Comment: one to two beer per day   Drug use: No   Sexual activity: Not on file  Other Topics Concern   Not on file  Social History Narrative   Not on file   Social Determinants of Health   Financial Resource Strain: Not on file  Food Insecurity: Not on file  Transportation Needs: Not on file  Physical Activity: Not on file  Stress: Not on file  Social Connections: Not on file   Outpatient Encounter Medications as of 09/12/2020  Medication Sig   carvedilol (COREG) 25 MG tablet TAKE 1 TABLET BY MOUTH TWICE A DAY   cloNIDine (CATAPRES) 0.1 MG tablet TAKE 1 TABLET BY MOUTH TWICE A DAY   levothyroxine (SYNTHROID) 100 MCG tablet TAKE 1 TABLET BY MOUTH EVERY DAY BEFORE BREAKFAST   metFORMIN (GLUCOPHAGE) 500 MG tablet TAKE 1 TABLET BY MOUTH TWICE A DAY WITH MEALS   olmesartan-hydrochlorothiazide (BENICAR HCT) 40-25 MG tablet TAKE 1 TABLET BY MOUTH EVERY DAY   rosuvastatin (CRESTOR) 10 MG tablet TAKE 1 TABLET BY MOUTH EVERY DAY   Vitamin D, Ergocalciferol, (DRISDOL)  1.25 MG (50000 UNIT) CAPS capsule Take 1 capsule (50,000 Units total) by mouth every 7 (seven) days.   No facility-administered encounter medications on file as of 09/12/2020.   ALLERGIES: No Known Allergies VACCINATION STATUS:  There is no immunization history on file for this patient.  Diabetes He presents for his follow-up diabetic visit. He has type 2 diabetes mellitus. His disease course has been worsening. There are no hypoglycemic associated symptoms. Pertinent negatives for hypoglycemia include no nervousness/anxiousness or tremors. Pertinent negatives for diabetes include no blurred vision, no chest pain, no fatigue, no polydipsia, no polyphagia, no polyuria, no weakness and no weight loss. There are no hypoglycemic complications. Symptoms are stable. Diabetic complications include nephropathy. Risk factors for coronary artery disease include diabetes mellitus, dyslipidemia, hypertension, male sex and sedentary lifestyle. Current diabetic treatment includes oral agent (monotherapy). He is compliant with treatment most of the time. His weight is fluctuating minimally. He is following a generally unhealthy diet. When asked about meal planning, he reported none. He has not had a previous visit with a dietitian. He rarely participates in exercise. (He presents today with no meter or logs to review.  He does not monitor glucose regularly due to being on Metformin only.  His POCT A1c today is 8.2%, increasing  from last visit of 6.9%.  He admits to eating more sweets recently and knew his A1c would be worse.  He denies any s/s of hypoglycemia.) An ACE inhibitor/angiotensin II receptor blocker is being taken. He does not see a podiatrist.Eye exam is current.  Thyroid Problem Presents for follow-up (75 yr old patient with Graves' disease status post radioactive iodine ablation of the thyroid on June 15, 2011.  ) visit. Patient reports no anxiety, cold intolerance, constipation, depressed mood, diarrhea,  fatigue, heat intolerance, leg swelling, palpitations, tremors, weight gain or weight loss. The symptoms have been stable.  Hypertension This is a chronic problem. The current episode started more than 1 year ago. The problem is unchanged. The problem is uncontrolled. Pertinent negatives include no blurred vision, chest pain or palpitations. Agents associated with hypertension include thyroid hormones. Risk factors for coronary artery disease include diabetes mellitus, dyslipidemia, male gender and sedentary lifestyle. Past treatments include diuretics, central alpha agonists, angiotensin blockers and beta blockers. The current treatment provides mild improvement. There are no compliance problems.  Hypertensive end-organ damage includes kidney disease. Identifiable causes of hypertension include chronic renal disease and a thyroid problem.     Review of systems  Constitutional: + steadily increasing body weight,  current Body mass index is 28.52 kg/m. , no fatigue, no subjective hyperthermia, no subjective hypothermia Eyes: no blurry vision, no xerophthalmia ENT: no sore throat, no nodules palpated in throat, no dysphagia/odynophagia, no hoarseness Cardiovascular: no chest pain, no shortness of breath, no palpitations, no leg swelling Respiratory: no cough, no shortness of breath Gastrointestinal: no nausea/vomiting/diarrhea Musculoskeletal: no muscle/joint aches Skin: no rashes, no hyperemia Neurological: no tremors, no numbness, no tingling, no dizziness Psychiatric: no depression, no anxiety   Objective:    BP (!) 185/78   Pulse 66   Ht _0  (1.854 m)   Wt 216 lb 3.2 oz (98.1 kg)   BMI 28.52 kg/m   Wt Readings from Last 3 Encounters:  09/12/20 216 lb 3.2 oz (98.1 kg)  03/09/20 210 lb (95.3 kg)  11/03/19 207 lb 9.6 oz (94.2 kg)    BP Readings from Last 3 Encounters:  09/12/20 (!) 185/78  03/09/20 (!) 181/87  11/03/19 (!) 185/82    Physical Exam- Limited  Constitutional:   Body mass index is 28.52 kg/m. , not in acute distress, normal state of mind Eyes:  EOMI, no exophthalmos Neck: Supple Cardiovascular: RRR, no murmurs, rubs, or gallops, no edema Respiratory: Adequate breathing efforts, no crackles, rales, rhonchi, or wheezing Musculoskeletal: no gross deformities, strength intact in all four extremities, no gross restriction of joint movements Skin:  no rashes, no hyperemia Neurological: no tremor with outstretched hands    Recent Results (from the past 2160 hour(s))  COMPLETE METABOLIC PANEL WITH GFR     Status: Abnormal   Collection Time: 09/04/20  7:12 AM  Result Value Ref Range   Glucose, Bld 167 (H) 65 - 99 mg/dL    Comment: .            Fasting reference interval . For someone without known diabetes, a glucose value >125 mg/dL indicates that they may have diabetes and this should be confirmed with a follow-up test. .    BUN 24 7 - 25 mg/dL   Creat 1.25 0.70 - 1.28 mg/dL   eGFR 61 > OR = 60 mL/min/1.27m    Comment: The eGFR is based on the CKD-EPI 2021 equation. To calculate  the new eGFR from a previous Creatinine or  Cystatin C result, go to https://www.kidney.org/professionals/ kdoqi/gfr%5Fcalculator    BUN/Creatinine Ratio NOT APPLICABLE 6 - 22 (calc)   Sodium 139 135 - 146 mmol/L   Potassium 4.8 3.5 - 5.3 mmol/L   Chloride 105 98 - 110 mmol/L   CO2 27 20 - 32 mmol/L   Calcium 10.1 8.6 - 10.3 mg/dL   Total Protein 7.5 6.1 - 8.1 g/dL   Albumin 4.6 3.6 - 5.1 g/dL   Globulin 2.9 1.9 - 3.7 g/dL (calc)   AG Ratio 1.6 1.0 - 2.5 (calc)   Total Bilirubin 0.4 0.2 - 1.2 mg/dL   Alkaline phosphatase (APISO) 53 35 - 144 U/L   AST 12 10 - 35 U/L   ALT 12 9 - 46 U/L  VITAMIN D 25 Hydroxy (Vit-D Deficiency, Fractures)     Status: Abnormal   Collection Time: 09/04/20  7:12 AM  Result Value Ref Range   Vit D, 25-Hydroxy 18 (L) 30 - 100 ng/mL    Comment: Vitamin D Status         25-OH Vitamin D: . Deficiency:                    <20  ng/mL Insufficiency:             20 - 29 ng/mL Optimal:                 > or = 30 ng/mL . For 25-OH Vitamin D testing on patients on  D2-supplementation and patients for whom quantitation  of D2 and D3 fractions is required, the [facility](TM) 25-OH VIT D, (D2,D3), LC/MS/MS is recommended: order  code [phone] (patients >66yr). See Note 1 . Note 1 . For additional information, please refer to  http://education.[facility].com/faq/FAQ199  (This link is being provided for informational/ educational purposes only.)   TSH     Status: None   Collection Time: 09/04/20  7:12 AM  Result Value Ref Range   TSH 1.07 0.40 - 4.50 mIU/L  T4, free     Status: None   Collection Time: 09/04/20  7:12 AM  Result Value Ref Range   Free T4 1.4 0.8 - 1.8 ng/dL  POCT glycosylated hemoglobin (Hb A1C)     Status: Abnormal   Collection Time: 09/12/20  3:56 PM  Result Value Ref Range   Hemoglobin A1C 8.2 (A) 4.0 - 5.6 %   HbA1c POC (<> result, manual entry)     HbA1c, POC (prediabetic range)     HbA1c, POC (controlled diabetic range)    POCT UA - Microalbumin     Status: Normal   Collection Time: 09/12/20  3:56 PM  Result Value Ref Range   Microalbumin Ur, POC 30 mg/L   Creatinine, POC 300 mg/dL   Albumin/Creatinine Ratio, Urine, POC <30    Recent A1c was 6.8%.  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  He is advised to continue Levothyroxine 100 mcg po daily before breakfast.   - We discussed about the correct intake of his thyroid hormone, on empty stomach at fasting, with water, separated by at least 30 minutes from breakfast and other medications,  and separated by more than 4 hours from calcium, iron, multivitamins, acid reflux medications (PPIs). -Patient is made aware of the fact that thyroid hormone replacement is needed for life, dose to be adjusted by periodic monitoring of thyroid  function tests.  2.  Hypertension: -  His blood pressure is not controlled to target again today.  His BP is always elevated at visits, reports some white coat syndrome.  He reports BP is better when he monitors it at home.  He is advised to continue Coreg 25 mg po daily, Clonidine 0.1 mg po BID, and Benicar- HCT 40-25 mg po daily.  3. Diabetes mellitus without complication ([place])  He presents today with no meter or logs to review.  He does not monitor glucose regularly due to being on Metformin only.  His POCT A1c today is 8.2%, increasing from last visit of 6.9%.  He admits to eating more sweets recently and knew his A1c would be worse.  He denies any s/s of hypoglycemia.  - Nutritional counseling repeated at each appointment due to patients tendency to fall back in to old habits.  - The patient admits there is a room for improvement in their diet and drink choices. -  Suggestion is made for the patient to avoid simple carbohydrates from their diet including Cakes, Sweet Desserts / Pastries, Ice Cream, Soda (diet and regular), Sweet Tea, Candies, Chips, Cookies, Sweet Pastries, Store Bought Juices, Alcohol in Excess of 1-2 drinks a day, Artificial Sweeteners, Coffee Creamer, and "Sugar-free" Products. This will help patient to have stable blood glucose profile and potentially avoid unintended weight gain.   - I encouraged the patient to switch to unprocessed or minimally processed complex starch and increased protein intake (animal or plant source), fruits, and vegetables.   - Patient is advised to stick to a routine mealtimes to eat 3 meals a day and avoid unnecessary snacks (to snack only to correct hypoglycemia).  -Although is A1c is worse today, he would like to work on diet and exercise before adding additional agents to his medication regimen.  He will be given an opportunity to improve this, and has done it before.  He is advised to continue Metformin 500 mg po twice daily with meals.   4.  Hyperlipidemia:  His lipid panel from 10/27/19 shows controlled LDL of 49.  He is advised to continue Crestor 10 mg po daily at bedtime.  Side effects and precautions discussed with him.  Will recheck lipid panel prior to next visit.  5. Vitamin D deficiency: His most recent vitamin D level from 09/04/20 was 18.  He is not currently on any supplementation.  I discussed and initiated replacement with Ergocalciferol 50000 units weekly.    I spent 35 minutes in the care of the patient today including review of labs from [place], Lipids, Thyroid Function, Hematology (current and previous including abstractions from other facilities); face-to-face time discussing  his blood glucose readings/logs, discussing hypoglycemia and hyperglycemia episodes and symptoms, medications doses, his options of short and long term treatment based on the latest standards of care / guidelines;  discussion about incorporating lifestyle medicine;  and documenting the encounter.    Please refer to Patient Instructions for Blood Glucose Monitoring and Insulin/Medications Dosing Guide"  in media tab for additional information. Please  also refer to " Patient Self Inventory" in the Media  tab for reviewed elements of pertinent patient history.  Jacob Lowe participated in the discussions, expressed understanding, and voiced agreement with the above plans.  All questions were answered to his satisfaction. he is encouraged to contact clinic should he have any questions or concerns prior to his return visit.    Follow up plan: Return in about 6 months (around 03/12/2021) for Diabetes F/U with A1c in office, Thyroid  follow up, Previsit labs.    Rayetta Pigg, [facility] [address] [place], [place] [address] Phone: [phone] Fax: [phone]  09/12/2020, 3:58 PM

## 2020-10-06 ENCOUNTER — Other Ambulatory Visit: Payer: Self-pay | Admitting: Nurse Practitioner

## 2020-10-14 ENCOUNTER — Other Ambulatory Visit: Payer: Self-pay | Admitting: "Endocrinology

## 2020-10-22 ENCOUNTER — Other Ambulatory Visit: Payer: Self-pay | Admitting: Nurse Practitioner

## 2020-10-23 NOTE — Telephone Encounter (Signed)
Last OV 09/12/2020  Per last OV note states: Coreg 25 mg po daily  Per request states to take BID  Please advise correct dosage. Thank you.

## 2020-10-27 ENCOUNTER — Other Ambulatory Visit: Payer: Self-pay | Admitting: Nurse Practitioner

## 2020-11-20 ENCOUNTER — Other Ambulatory Visit: Payer: Self-pay | Admitting: "Endocrinology

## 2021-01-02 ENCOUNTER — Other Ambulatory Visit: Payer: Self-pay | Admitting: Nurse Practitioner

## 2021-01-04 ENCOUNTER — Other Ambulatory Visit: Payer: Self-pay | Admitting: Nurse Practitioner

## 2021-01-04 DIAGNOSIS — E559 Vitamin D deficiency, unspecified: Secondary | ICD-10-CM

## 2021-01-19 ENCOUNTER — Other Ambulatory Visit: Payer: Self-pay | Admitting: Nurse Practitioner

## 2021-01-25 ENCOUNTER — Other Ambulatory Visit: Payer: Self-pay | Admitting: Nurse Practitioner

## 2021-03-06 LAB — COMPREHENSIVE METABOLIC PANEL
AG Ratio: 1.4 (calc) (ref 1.0–2.5)
ALT: 12 U/L (ref 9–46)
AST: 13 U/L (ref 10–35)
Albumin: 4.8 g/dL (ref 3.6–5.1)
Alkaline phosphatase (APISO): 51 U/L (ref 35–144)
BUN: 18 mg/dL (ref 7–25)
CO2: 25 mmol/L (ref 20–32)
Calcium: 10.1 mg/dL (ref 8.6–10.3)
Chloride: 104 mmol/L (ref 98–110)
Creat: 1.26 mg/dL (ref 0.70–1.28)
Globulin: 3.5 g/dL (calc) (ref 1.9–3.7)
Glucose, Bld: 131 mg/dL — ABNORMAL HIGH (ref 65–99)
Potassium: 4.5 mmol/L (ref 3.5–5.3)
Sodium: 140 mmol/L (ref 135–146)
Total Bilirubin: 0.5 mg/dL (ref 0.2–1.2)
Total Protein: 8.3 g/dL — ABNORMAL HIGH (ref 6.1–8.1)

## 2021-03-06 LAB — VITAMIN D 25 HYDROXY (VIT D DEFICIENCY, FRACTURES): Vit D, 25-Hydroxy: 67 ng/mL (ref 30–100)

## 2021-03-06 LAB — LIPID PANEL
Cholesterol: 102 mg/dL (ref <200)
HDL: 33 mg/dL — ABNORMAL LOW (ref >=40)
LDL Cholesterol (Calc): 48 mg/dL (calc)
Non-HDL Cholesterol (Calc): 69 mg/dL (calc) (ref <130)
Total CHOL/HDL Ratio: 3.1 (calc) (ref <5.0)
Triglycerides: 126 mg/dL (ref <150)

## 2021-03-06 LAB — TSH: TSH: 0.76 mIU/L (ref 0.40–4.50)

## 2021-03-06 LAB — T4, FREE: Free T4: 1.7 ng/dL (ref 0.8–1.8)

## 2021-03-12 ENCOUNTER — Encounter: Payer: Self-pay | Admitting: Nurse Practitioner

## 2021-03-12 ENCOUNTER — Other Ambulatory Visit: Payer: Self-pay

## 2021-03-12 ENCOUNTER — Ambulatory Visit (INDEPENDENT_AMBULATORY_CARE_PROVIDER_SITE_OTHER): Payer: BC Managed Care – PPO | Admitting: Nurse Practitioner

## 2021-03-12 VITALS — BP 182/68 | HR 66 | Ht 73.0 in | Wt 217.8 lb

## 2021-03-12 DIAGNOSIS — E119 Type 2 diabetes mellitus without complications: Secondary | ICD-10-CM | POA: Diagnosis not present

## 2021-03-12 LAB — POCT GLYCOSYLATED HEMOGLOBIN (HGB A1C): HbA1c, POC (controlled diabetic range): 7.7 % — AB (ref 0.0–7.0)

## 2021-03-12 NOTE — Progress Notes (Signed)
03/12/2021  Endocrinology follow-up note   Subjective:    Patient ID: Jacob Lowe, male    DOB: 1945/07/27,   Past Medical History:  Diagnosis Date   Diabetes mellitus    Hypertension    Hypertension    Hyperthyroidism    Resolved  after therapy with I-131   Hypothyroidism    Past Surgical History:  Procedure Laterality Date   COLONOSCOPY N/A 06/11/2017   Procedure: COLONOSCOPY;  Surgeon: Malissa Hippoehman, Najeeb U, MD;  Location: [facility];  Service: Endoscopy;  Laterality: N/A;  830   Social History   Socioeconomic History   Marital status: Married    Spouse name: Not on file   Number of children: Not on file   Years of education: Not on file   Highest education level: Not on file  Occupational History   Not on file  Tobacco Use   Smoking status: Former   Smokeless tobacco: Never  Vaping Use   Vaping Use: Never used  Substance and Sexual Activity   Alcohol use: Yes    Alcohol/week: 0.0 standard drinks    Comment: one to two beer per day   Drug use: No   Sexual activity: Not on file  Other Topics Concern   Not on file  Social History Narrative   Not on file   Social Determinants of Health   Financial Resource Strain: Not on file  Food Insecurity: Not on file  Transportation Needs: Not on file  Physical Activity: Not on file  Stress: Not on file  Social Connections: Not on file   Outpatient Encounter Medications as of 03/12/2021  Medication Sig   amLODipine (NORVASC) 10 MG tablet Take 10 mg by mouth daily.   carvedilol (COREG) 25 MG tablet TAKE 1 TABLET BY MOUTH TWICE A DAY   cloNIDine (CATAPRES) 0.1 MG tablet TAKE 1 TABLET BY MOUTH TWICE A DAY   levothyroxine (SYNTHROID) 100 MCG tablet TAKE 1 TABLET BY MOUTH EVERY DAY BEFORE BREAKFAST   metFORMIN (GLUCOPHAGE) 500 MG tablet TAKE 1 TABLET BY MOUTH TWICE A DAY WITH MEALS   olmesartan-hydrochlorothiazide (BENICAR HCT) 40-25 MG tablet TAKE 1 TABLET BY MOUTH EVERY DAY   rosuvastatin (CRESTOR) 10 MG tablet TAKE 1  TABLET BY MOUTH EVERY DAY   Vitamin D, Ergocalciferol, (DRISDOL) 1.25 MG (50000 UNIT) CAPS capsule TAKE 1 CAPSULE (50,000 UNITS TOTAL) BY MOUTH EVERY 7 (SEVEN) DAYS   No facility-administered encounter medications on file as of 03/12/2021.   ALLERGIES: No Known Allergies VACCINATION STATUS:  There is no immunization history on file for this patient.  Diabetes He presents for his follow-up diabetic visit. He has type 2 diabetes mellitus. His disease course has been improving. There are no hypoglycemic associated symptoms. Pertinent negatives for hypoglycemia include no nervousness/anxiousness or tremors. Pertinent negatives for diabetes include no blurred vision, no chest pain, no fatigue, no polydipsia, no polyphagia, no polyuria, no weakness and no weight loss. There are no hypoglycemic complications. Symptoms are stable. Diabetic complications include nephropathy. Risk factors for coronary artery disease include diabetes mellitus, dyslipidemia, hypertension, male sex and sedentary lifestyle. Current diabetic treatment includes oral agent (monotherapy). He is compliant with treatment most of the time. His weight is fluctuating minimally. He is following a generally healthy diet. When asked about meal planning, he reported none. He has not had a previous visit with a dietitian. He rarely participates in exercise. (He presents today with no meter or logs to review.  He does not monitor glucose regularly due  to being on Metformin only.  His POCT A1c today is 7.7%, improving from last visit of 8.2%.  He has joined a gym and he and his wife plan to incorporate more exercise into their daily routine.  He denies any s/s of hypoglycemia.  ) An ACE inhibitor/angiotensin II receptor blocker is being taken. He does not see a podiatrist.Eye exam is current.  Thyroid Problem Presents for follow-up (76 yr old patient with Graves' disease status post radioactive iodine ablation of the thyroid on June 15, 2011.  )  visit. Patient reports no anxiety, cold intolerance, constipation, depressed mood, diarrhea, fatigue, heat intolerance, leg swelling, palpitations, tremors, weight gain or weight loss. The symptoms have been stable.  Hypertension This is a chronic problem. The current episode started more than 1 year ago. The problem is unchanged. The problem is uncontrolled. Pertinent negatives include no blurred vision, chest pain or palpitations. Agents associated with hypertension include thyroid hormones. Risk factors for coronary artery disease include diabetes mellitus, dyslipidemia, male gender and sedentary lifestyle. Past treatments include diuretics, central alpha agonists, angiotensin blockers and beta blockers. The current treatment provides mild improvement. There are no compliance problems.  Hypertensive end-organ damage includes kidney disease. Identifiable causes of hypertension include chronic renal disease and a thyroid problem.     Review of systems  Constitutional: + steadily increasing body weight,  current Body mass index is 28.74 kg/m. , no fatigue, no subjective hyperthermia, no subjective hypothermia Eyes: no blurry vision, no xerophthalmia ENT: no sore throat, no nodules palpated in throat, no dysphagia/odynophagia, no hoarseness Cardiovascular: no chest pain, no shortness of breath, no palpitations, no leg swelling Respiratory: no cough, no shortness of breath Gastrointestinal: no nausea/vomiting/diarrhea Musculoskeletal: no muscle/joint aches Skin: no rashes, no hyperemia Neurological: no tremors, no numbness, no tingling, no dizziness Psychiatric: no depression, no anxiety   Objective:    BP (!) 182/68    Pulse 66    Ht 6\' 1"  (1.854 m)    Wt 217 lb 12.8 oz (98.8 kg)    SpO2 99%    BMI 28.74 kg/m   Wt Readings from Last 3 Encounters:  03/12/21 217 lb 12.8 oz (98.8 kg)  09/12/20 216 lb 3.2 oz (98.1 kg)  03/09/20 210 lb (95.3 kg)    BP Readings from Last 3 Encounters:   03/12/21 (!) 182/68  09/12/20 (!) 185/78  03/09/20 (!) 181/87     Physical Exam- Limited  Constitutional:  Body mass index is 28.74 kg/m. , not in acute distress, normal state of mind Eyes:  EOMI, no exophthalmos Neck: Supple Cardiovascular: RRR, no murmurs, rubs, or gallops, no edema Respiratory: Adequate breathing efforts, no crackles, rales, rhonchi, or wheezing Musculoskeletal: no gross deformities, strength intact in all four extremities, no gross restriction of joint movements Skin:  no rashes, no hyperemia Neurological: no tremor with outstretched hands  Foot exam:  No rashes, ulcers, cuts, calluses, onychodystrophy.  Good pulses bilat. Good sensation to 10 g monofilament bilat.  Recent Results (from the past 2160 hour(s))  Comprehensive metabolic panel     Status: Abnormal   Collection Time: 03/05/21  8:16 AM  Result Value Ref Range   Glucose, Bld 131 (H) 65 - 99 mg/dL    Comment: .            Fasting reference interval . For someone without known diabetes, a glucose value >125 mg/dL indicates that they may have diabetes and this should be confirmed with a follow-up test. .  BUN 18 7 - 25 mg/dL   Creat 8.111.26 9.140.70 - 7.821.28 mg/dL   BUN/Creatinine Ratio NOT APPLICABLE 6 - 22 (calc)   Sodium 140 135 - 146 mmol/L   Potassium 4.5 3.5 - 5.3 mmol/L   Chloride 104 98 - 110 mmol/L   CO2 25 20 - 32 mmol/L   Calcium 10.1 8.6 - 10.3 mg/dL   Total Protein 8.3 (H) 6.1 - 8.1 g/dL   Albumin 4.8 3.6 - 5.1 g/dL   Globulin 3.5 1.9 - 3.7 g/dL (calc)   AG Ratio 1.4 1.0 - 2.5 (calc)   Total Bilirubin 0.5 0.2 - 1.2 mg/dL   Alkaline phosphatase (APISO) 51 35 - 144 U/L   AST 13 10 - 35 U/L   ALT 12 9 - 46 U/L  Lipid panel     Status: Abnormal   Collection Time: 03/05/21  8:16 AM  Result Value Ref Range   Cholesterol 102 <200 mg/dL   HDL 33 (L) > OR = 40 mg/dL   Triglycerides 956126 <213<150 mg/dL   LDL Cholesterol (Calc) 48 mg/dL (calc)    Comment: Reference range:  <100 . Desirable range <100 mg/dL for primary prevention;   <70 mg/dL for patients with CHD or diabetic patients  with > or = 2 CHD risk factors. Marland Kitchen. LDL-C is now calculated using the Martin-Hopkins  calculation, which is a validated novel method providing  better accuracy than the Friedewald equation in the  estimation of LDL-C.  Horald PollenMartin SS et al. Lenox AhrJAMA. 0865;784(692013;310(19): 2061-2068  (http://education.[facility].com/faq/FAQ164)    Total CHOL/HDL Ratio 3.1 <5.0 (calc)   Non-HDL Cholesterol (Calc) 69 <629<130 mg/dL (calc)    Comment: For patients with diabetes plus 1 major ASCVD risk  factor, treating to a non-HDL-C goal of <100 mg/dL  (LDL-C of <52<70 mg/dL) is considered a therapeutic  option.   T4, free     Status: None   Collection Time: 03/05/21  8:16 AM  Result Value Ref Range   Free T4 1.7 0.8 - 1.8 ng/dL  TSH     Status: None   Collection Time: 03/05/21  8:16 AM  Result Value Ref Range   TSH 0.76 0.40 - 4.50 mIU/L  VITAMIN D 25 Hydroxy (Vit-D Deficiency, Fractures)     Status: None   Collection Time: 03/05/21  8:16 AM  Result Value Ref Range   Vit D, 25-Hydroxy 67 30 - 100 ng/mL    Comment: Vitamin D Status         25-OH Vitamin D: . Deficiency:                    <20 ng/mL Insufficiency:             20 - 29 ng/mL Optimal:                 > or = 30 ng/mL . For 25-OH Vitamin D testing on patients on  D2-supplementation and patients for whom quantitation  of D2 and D3 fractions is required, the [facility](TM) 25-OH VIT D, (D2,D3), LC/MS/MS is recommended: order  code [id number] (patients >494yrs). See Note 1 . Note 1 . For additional information, please refer to  http://education.[facility].com/faq/FAQ199  (This link is being provided for informational/ educational purposes only.)   POCT glycosylated hemoglobin (Hb A1C)     Status: Abnormal   Collection Time: 03/12/21  3:46 PM  Result Value Ref Range   Hemoglobin A1C     HbA1c POC (<> result, manual entry)  HbA1c, POC (prediabetic range)     HbA1c, POC (controlled diabetic range) 7.7 (A) 0.0 - 7.0 %   Recent A1c was 6.8%.  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  He is advised to continue Levothyroxine 100 mcg po daily before breakfast.   - We discussed about the correct intake of his thyroid hormone, on empty stomach at fasting, with water, separated by at least 30 minutes from breakfast and other medications,  and separated by more than 4 hours from calcium, iron, multivitamins, acid reflux medications (PPIs). -Patient is made aware of the fact that thyroid hormone replacement is needed for life, dose to be adjusted by periodic monitoring of thyroid function tests.  2.  Hypertension: -His blood pressure is not controlled to target again today.  His BP is always elevated at visits, reports some white coat syndrome.  He reports BP is better when he monitors it at home.  He is advised to continue Coreg 25 mg po daily, Clonidine 0.1 mg po BID, and Benicar- HCT 40-25 mg po daily.  3. Diabetes mellitus without complication (HCC)  He presents today with no meter or logs to review.  He does not monitor glucose regularly due to being on Metformin only.  His POCT A1c today is 7.7%, improving from last visit of 8.2%.  He has joined a gym and he and his wife plan to incorporate more exercise into their daily routine.  He denies any s/s of hypoglycemia.    - Nutritional counseling repeated at each appointment due to patients tendency to fall back in to old habits.  - The patient admits there is a room for improvement in their diet and drink choices. -  Suggestion is made for the patient to avoid simple carbohydrates from their diet including Cakes, Sweet Desserts / Pastries, Ice Cream, Soda (diet and regular), Sweet Tea, Candies, Chips, Cookies, Sweet Pastries, Store Bought Juices, Alcohol in Excess of 1-2  drinks a day, Artificial Sweeteners, Coffee Creamer, and "Sugar-free" Products. This will help patient to have stable blood glucose profile and potentially avoid unintended weight gain.   - I encouraged the patient to switch to unprocessed or minimally processed complex starch and increased protein intake (animal or plant source), fruits, and vegetables.   - Patient is advised to stick to a routine mealtimes to eat 3 meals a day and avoid unnecessary snacks (to snack only to correct hypoglycemia).  -Given his improved glycemic profile, no changes will be made to his medications today.  He is advised to continue Metformin 500 mg po twice daily with meals.   4. Hyperlipidemia:  His lipid panel from 03/05/21 shows controlled LDL of 48.  He is advised to continue Crestor 10 mg po daily at bedtime.  Side effects and precautions discussed with him.    5. Vitamin D deficiency: His most recent vitamin D level from 03/05/21 was 67. He can take a break from vitamin D supplementation at this time.       I spent 30 minutes in the care of the patient today including review of labs from CMP, Lipids, Thyroid Function, Hematology (current and previous including abstractions from other facilities); face-to-face time discussing  his blood glucose readings/logs, discussing hypoglycemia and hyperglycemia episodes and symptoms, medications doses, his options of short and long term treatment based on the latest standards of care / guidelines;  discussion about incorporating lifestyle medicine;  and  documenting the encounter.    Please refer to Patient Instructions for Blood Glucose Monitoring and Insulin/Medications Dosing Guide"  in media tab for additional information. Please  also refer to " Patient Self Inventory" in the Media  tab for reviewed elements of pertinent patient history.  Jacob Lucks participated in the discussions, expressed understanding, and voiced agreement with the above plans.  All questions  were answered to his satisfaction. he is encouraged to contact clinic should he have any questions or concerns prior to his return visit.    Follow up plan: Return in about 6 months (around 09/12/2021) for Diabetes F/U with A1c in office, Thyroid follow up, No previsit labs, Bring meter and logs.   Ronny Bacon, [facility] [place] [facility] [address] [place], [place] [address] Phone: [phone] Fax: [phone]  03/12/2021, 6:15 PM

## 2021-03-31 ENCOUNTER — Other Ambulatory Visit: Payer: Self-pay | Admitting: Nurse Practitioner

## 2021-04-09 ENCOUNTER — Other Ambulatory Visit: Payer: Self-pay | Admitting: "Endocrinology

## 2021-04-16 ENCOUNTER — Other Ambulatory Visit: Payer: Self-pay | Admitting: Nurse Practitioner

## 2021-04-16 NOTE — Telephone Encounter (Signed)
Can you take a look at the instructions on the prescription versus the instructions in last OV note on 03/12/2021? ?Just want to make sure correct before sending. Thank you! ?

## 2021-05-15 ENCOUNTER — Other Ambulatory Visit: Payer: Self-pay | Admitting: "Endocrinology

## 2021-07-13 ENCOUNTER — Other Ambulatory Visit: Payer: Self-pay | Admitting: Nurse Practitioner

## 2021-08-16 ENCOUNTER — Other Ambulatory Visit: Payer: Self-pay | Admitting: Nurse Practitioner

## 2021-09-13 ENCOUNTER — Encounter: Payer: Self-pay | Admitting: Nurse Practitioner

## 2021-09-13 ENCOUNTER — Ambulatory Visit: Payer: BC Managed Care – PPO | Admitting: Nurse Practitioner

## 2021-09-13 VITALS — BP 161/75 | HR 57 | Ht 73.0 in | Wt 217.4 lb

## 2021-09-13 DIAGNOSIS — E785 Hyperlipidemia, unspecified: Secondary | ICD-10-CM

## 2021-09-13 DIAGNOSIS — E559 Vitamin D deficiency, unspecified: Secondary | ICD-10-CM

## 2021-09-13 DIAGNOSIS — I10 Essential (primary) hypertension: Secondary | ICD-10-CM

## 2021-09-13 DIAGNOSIS — E890 Postprocedural hypothyroidism: Secondary | ICD-10-CM | POA: Diagnosis not present

## 2021-09-13 DIAGNOSIS — E119 Type 2 diabetes mellitus without complications: Secondary | ICD-10-CM

## 2021-09-13 LAB — POCT GLYCOSYLATED HEMOGLOBIN (HGB A1C): Hemoglobin A1C: 8 % — AB (ref 4.0–5.6)

## 2021-09-13 MED ORDER — ACCU-CHEK GUIDE VI STRP: ORAL_STRIP | 12 refills | Status: AC

## 2021-09-13 MED ORDER — ACCU-CHEK SOFTCLIX LANCETS MISC: 12 refills | Status: AC

## 2021-09-13 NOTE — Progress Notes (Signed)
09/13/2021  Endocrinology follow-up note   Subjective:    Patient ID: Jacob Lowe, male    DOB: 1945/09/16,   Past Medical History:  Diagnosis Date   Diabetes mellitus    Hypertension    Hypertension    Hyperthyroidism    Resolved  after therapy with [id number]   Hypothyroidism    Past Surgical History:  Procedure Laterality Date   COLONOSCOPY N/A 06/11/2017   Procedure: COLONOSCOPY;  Surgeon: Malissa Hippo, MD;  Location: [facility];  Service: Endoscopy;  Laterality: N/A;  830   Social History   Socioeconomic History   Marital status: Married    Spouse name: Not on file   Number of children: Not on file   Years of education: Not on file   Highest education level: Not on file  Occupational History   Not on file  Tobacco Use   Smoking status: Former   Smokeless tobacco: Never  Vaping Use   Vaping Use: Never used  Substance and Sexual Activity   Alcohol use: Yes    Alcohol/week: 0.0 standard drinks of alcohol    Comment: one to two beer per day   Drug use: No   Sexual activity: Not on file  Other Topics Concern   Not on file  Social History Narrative   Not on file   Social Determinants of Health   Financial Resource Strain: Not on file  Food Insecurity: Not on file  Transportation Needs: Not on file  Physical Activity: Not on file  Stress: Not on file  Social Connections: Not on file   Outpatient Encounter Medications as of 09/13/2021  Medication Sig   Accu-Chek Softclix Lancets lancets Use as instructed to monitor glucose once daily.   amLODipine (NORVASC) 10 MG tablet Take 10 mg by mouth daily.   carvedilol (COREG) 25 MG tablet TAKE 1 TABLET BY MOUTH TWICE A DAY   cloNIDine (CATAPRES) 0.1 MG tablet TAKE 1 TABLET BY MOUTH TWICE A DAY   glucose blood (ACCU-CHEK GUIDE) test strip Use as instructed to monitor glucose once daily   levothyroxine (SYNTHROID) 100 MCG tablet TAKE 1 TABLET BY MOUTH EVERY DAY BEFORE BREAKFAST   metFORMIN (GLUCOPHAGE) 500 MG  tablet TAKE 1 TABLET BY MOUTH TWICE A DAY WITH MEALS   olmesartan-hydrochlorothiazide (BENICAR HCT) 40-25 MG tablet TAKE 1 TABLET BY MOUTH EVERY DAY   rosuvastatin (CRESTOR) 10 MG tablet TAKE 1 TABLET BY MOUTH EVERY DAY   Vitamin D, Ergocalciferol, (DRISDOL) 1.25 MG (50000 UNIT) CAPS capsule TAKE 1 CAPSULE (50,000 UNITS TOTAL) BY MOUTH EVERY 7 (SEVEN) DAYS   No facility-administered encounter medications on file as of 09/13/2021.   ALLERGIES: No Known Allergies VACCINATION STATUS:  There is no immunization history on file for this patient.  Diabetes He presents for his follow-up diabetic visit. He has type 2 diabetes mellitus. His disease course has been stable. There are no hypoglycemic associated symptoms. Pertinent negatives for hypoglycemia include no nervousness/anxiousness or tremors. Pertinent negatives for diabetes include no blurred vision, no chest pain, no fatigue, no polydipsia, no polyphagia, no polyuria, no weakness and no weight loss. There are no hypoglycemic complications. Symptoms are stable. Diabetic complications include nephropathy. Risk factors for coronary artery disease include diabetes mellitus, dyslipidemia, hypertension, male sex and sedentary lifestyle. Current diabetic treatment includes oral agent (monotherapy). He is compliant with treatment most of the time. His weight is fluctuating minimally. He is following a generally healthy diet. When asked about meal planning, he reported none. He  has not had a previous visit with a dietitian. He rarely participates in exercise. (He presents today with no meter or logs to review.  He does not monitor glucose regularly due to being on Metformin only.  His POCT A1c today is 8% increasing slightly from last visit of 7.7%.  He denies any recent illness or steroid injections/oral steroids recently.  In fact, he has started working out more (4 days per week for up to 2 hours at a time) and eats fairly healthy.  He denies any s/s of  hypoglycemia.) An ACE inhibitor/angiotensin II receptor blocker is being taken. He does not see a podiatrist.Eye exam is current.  Thyroid Problem Presents for follow-up (76 yr old patient with Graves' disease status post radioactive iodine ablation of the thyroid on June 15, 2011.  ) visit. Patient reports no anxiety, cold intolerance, constipation, depressed mood, diarrhea, fatigue, heat intolerance, leg swelling, palpitations, tremors, weight gain or weight loss. The symptoms have been stable.  Hypertension This is a chronic problem. The current episode started more than 1 year ago. The problem is unchanged. The problem is uncontrolled. Pertinent negatives include no blurred vision, chest pain or palpitations. Agents associated with hypertension include thyroid hormones. Risk factors for coronary artery disease include diabetes mellitus, dyslipidemia, male gender and sedentary lifestyle. Past treatments include diuretics, central alpha agonists, angiotensin blockers and beta blockers. The current treatment provides mild improvement. There are no compliance problems.  Hypertensive end-organ damage includes kidney disease. Identifiable causes of hypertension include chronic renal disease and a thyroid problem.      Review of systems  Constitutional: + stable body weight,  current Body mass index is 28.68 kg/m. , no fatigue, no subjective hyperthermia, no subjective hypothermia Eyes: no blurry vision, no xerophthalmia ENT: no sore throat, no nodules palpated in throat, no dysphagia/odynophagia, no hoarseness Cardiovascular: no chest pain, no shortness of breath, no palpitations, no leg swelling Respiratory: no cough, no shortness of breath Gastrointestinal: no nausea/vomiting/diarrhea Musculoskeletal: no muscle/joint aches Skin: no rashes, no hyperemia Neurological: no tremors, no numbness, no tingling, no dizziness Psychiatric: no depression, no anxiety   Objective:    BP (!) 161/75 (BP  Location: Left Arm, Patient Position: Sitting, Cuff Size: Large)   Pulse (!) 57   Ht 6\' 1"  (1.854 m)   Wt 217 lb 6.4 oz (98.6 kg)   BMI 28.68 kg/m   Wt Readings from Last 3 Encounters:  09/13/21 217 lb 6.4 oz (98.6 kg)  03/12/21 217 lb 12.8 oz (98.8 kg)  09/12/20 216 lb 3.2 oz (98.1 kg)    BP Readings from Last 3 Encounters:  09/13/21 (!) 161/75  03/12/21 (!) 182/68  09/12/20 (!) 185/78    Physical Exam- Limited  Constitutional:  Body mass index is 28.68 kg/m. , not in acute distress, normal state of mind Eyes:  EOMI, no exophthalmos Neck: Supple Cardiovascular: RRR, no murmurs, rubs, or gallops, no edema Respiratory: Adequate breathing efforts, no crackles, rales, rhonchi, or wheezing Musculoskeletal: no gross deformities, strength intact in all four extremities, no gross restriction of joint movements Skin:  no rashes, no hyperemia Neurological: no tremor with outstretched hands  Recent Results (from the past 2160 hour(s))  HgB A1c     Status: Abnormal   Collection Time: 09/13/21  3:42 PM  Result Value Ref Range   Hemoglobin A1C 8.0 (A) 4.0 - 5.6 %   HbA1c POC (<> result, manual entry)     HbA1c, POC (prediabetic range)     HbA1c,  POC (controlled diabetic range)      Recent A1c was 6.8%.  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  He is advised to continue Levothyroxine 100 mcg po daily before breakfast.  Will recheck TFTs prior to next visit and adjust dose if needed.   - We discussed about the correct intake of his thyroid hormone, on empty stomach at fasting, with water, separated by at least 30 minutes from breakfast and other medications,  and separated by more than 4 hours from calcium, iron, multivitamins, acid reflux medications (PPIs). -Patient is made aware of the fact that thyroid hormone replacement is needed for life, dose to be adjusted by periodic monitoring of thyroid function tests.  2.   Hypertension: -His blood pressure is not controlled to target again today.  His BP is always elevated at visits, reports some white coat syndrome.  He reports BP is better when he monitors it at home.  He is advised to continue Coreg 25 mg po daily, Clonidine 0.1 mg po BID, and Benicar- HCT 40-25 mg po daily.  3. Diabetes mellitus without complication (HCC)  He presents today with no meter or logs to review.  He does not monitor glucose regularly due to being on Metformin only.  His POCT A1c today is 8% increasing slightly from last visit of 7.7%.  He denies any recent illness or steroid injections/oral steroids recently.  In fact, he has started working out more (4 days per week for up to 2 hours at a time) and eats fairly healthy.  He denies any s/s of hypoglycemia.   - Nutritional counseling repeated at each appointment due to patients tendency to fall back in to old habits.  - The patient admits there is a room for improvement in their diet and drink choices. -  Suggestion is made for the patient to avoid simple carbohydrates from their diet including Cakes, Sweet Desserts / Pastries, Ice Cream, Soda (diet and regular), Sweet Tea, Candies, Chips, Cookies, Sweet Pastries, Store Bought Juices, Alcohol in Excess of 1-2 drinks a day, Artificial Sweeteners, Coffee Creamer, and "Sugar-free" Products. This will help patient to have stable blood glucose profile and potentially avoid unintended weight gain.   - I encouraged the patient to switch to unprocessed or minimally processed complex starch and increased protein intake (animal or plant source), fruits, and vegetables.   - Patient is advised to stick to a routine mealtimes to eat 3 meals a day and avoid unnecessary snacks (to snack only to correct hypoglycemia).  -No changes will be made to his medications today.  He is advised to continue Metformin 500 mg po twice daily with meals.   I did ask that he start checking his glucose once daily, before  breakfast and to notify my of his readings in 2 weeks or so to see if any changes are needed with his medications.  4. Hyperlipidemia:  His lipid panel from 03/05/21 shows controlled LDL of 48.  He is advised to continue Crestor 10 mg po daily at bedtime.  Side effects and precautions discussed with him.  Will recheck lipid panel prior to next visit.  5. Vitamin D deficiency: His most recent vitamin D level from 03/05/21 was 67. He can take a break from vitamin D supplementation at this time. Will recheck vitamin D prior to next visit.      I spent 40 minutes in the care of the patient today including  review of labs from CMP, Lipids, Thyroid Function, Hematology (current and previous including abstractions from other facilities); face-to-face time discussing  his blood glucose readings/logs, discussing hypoglycemia and hyperglycemia episodes and symptoms, medications doses, his options of short and long term treatment based on the latest standards of care / guidelines;  discussion about incorporating lifestyle medicine;  and documenting the encounter. Risk reduction counseling performed per USPSTF guidelines to reduce obesity and cardiovascular risk factors.     Please refer to Patient Instructions for Blood Glucose Monitoring and Insulin/Medications Dosing Guide"  in media tab for additional information. Please  also refer to " Patient Self Inventory" in the Media  tab for reviewed elements of pertinent patient history.  Jacob Lowe participated in the discussions, expressed understanding, and voiced agreement with the above plans.  All questions were answered to his satisfaction. he is encouraged to contact clinic should he have any questions or concerns prior to his return visit.    Follow up plan: Return in about 6 months (around 03/14/2022) for Diabetes F/U with A1c in office, Previsit labs, Bring meter and logs.   Ronny Bacon, [facility] [address] [place], [place] [address] Phone: [phone] Fax: [phone]  09/13/2021, 4:15 PM

## 2021-09-25 ENCOUNTER — Other Ambulatory Visit: Payer: Self-pay | Admitting: Nurse Practitioner

## 2021-09-27 ENCOUNTER — Other Ambulatory Visit: Payer: Self-pay | Admitting: "Endocrinology

## 2021-10-06 ENCOUNTER — Other Ambulatory Visit: Payer: Self-pay | Admitting: "Endocrinology

## 2021-10-08 ENCOUNTER — Other Ambulatory Visit: Payer: Self-pay | Admitting: Nurse Practitioner

## 2021-11-12 ENCOUNTER — Other Ambulatory Visit: Payer: Self-pay | Admitting: "Endocrinology

## 2021-12-13 ENCOUNTER — Other Ambulatory Visit: Payer: Self-pay | Admitting: Nurse Practitioner

## 2021-12-21 ENCOUNTER — Other Ambulatory Visit: Payer: Self-pay | Admitting: Nurse Practitioner

## 2022-01-06 ENCOUNTER — Other Ambulatory Visit: Payer: Self-pay | Admitting: "Endocrinology

## 2022-01-06 ENCOUNTER — Other Ambulatory Visit: Payer: Self-pay | Admitting: Nurse Practitioner

## 2022-03-09 LAB — COMPREHENSIVE METABOLIC PANEL
ALT: 11 IU/L (ref 0–44)
AST: 14 IU/L (ref 0–40)
Albumin/Globulin Ratio: 1.7 (ref 1.2–2.2)
Albumin: 4.9 g/dL — ABNORMAL HIGH (ref 3.8–4.8)
Alkaline Phosphatase: 61 IU/L (ref 44–121)
BUN/Creatinine Ratio: 22 (ref 10–24)
BUN: 31 mg/dL — ABNORMAL HIGH (ref 8–27)
Bilirubin Total: 0.4 mg/dL (ref 0.0–1.2)
CO2: 18 mmol/L — ABNORMAL LOW (ref 20–29)
Calcium: 10.2 mg/dL (ref 8.6–10.2)
Chloride: 102 mmol/L (ref 96–106)
Creatinine, Ser: 1.4 mg/dL — ABNORMAL HIGH (ref 0.76–1.27)
Globulin, Total: 2.9 g/dL (ref 1.5–4.5)
Glucose: 125 mg/dL — ABNORMAL HIGH (ref 70–99)
Potassium: 4.8 mmol/L (ref 3.5–5.2)
Sodium: 140 mmol/L (ref 134–144)
Total Protein: 7.8 g/dL (ref 6.0–8.5)
eGFR: 52 mL/min/{1.73_m2} — ABNORMAL LOW (ref >59)

## 2022-03-09 LAB — LIPID PANEL
Chol/HDL Ratio: 3.2 ratio (ref 0.0–5.0)
Cholesterol, Total: 100 mg/dL (ref 100–199)
HDL: 31 mg/dL — ABNORMAL LOW (ref >39)
LDL Chol Calc (NIH): 46 mg/dL (ref 0–99)
Triglycerides: 127 mg/dL (ref 0–149)
VLDL Cholesterol Cal: 23 mg/dL (ref 5–40)

## 2022-03-09 LAB — TSH: TSH: 0.779 u[IU]/mL (ref 0.450–4.500)

## 2022-03-09 LAB — T4, FREE: Free T4: 1.72 ng/dL (ref 0.82–1.77)

## 2022-03-09 LAB — VITAMIN D 25 HYDROXY (VIT D DEFICIENCY, FRACTURES): Vit D, 25-Hydroxy: 20.8 ng/mL — ABNORMAL LOW (ref 30.0–100.0)

## 2022-03-10 ENCOUNTER — Other Ambulatory Visit: Payer: Self-pay | Admitting: Nurse Practitioner

## 2022-03-14 ENCOUNTER — Ambulatory Visit: Payer: BC Managed Care – PPO | Admitting: Nurse Practitioner

## 2022-03-14 ENCOUNTER — Encounter: Payer: Self-pay | Admitting: Nurse Practitioner

## 2022-03-14 VITALS — BP 144/86 | HR 59 | Ht 72.0 in | Wt 208.0 lb

## 2022-03-14 DIAGNOSIS — E559 Vitamin D deficiency, unspecified: Secondary | ICD-10-CM

## 2022-03-14 DIAGNOSIS — E890 Postprocedural hypothyroidism: Secondary | ICD-10-CM | POA: Diagnosis not present

## 2022-03-14 DIAGNOSIS — I10 Essential (primary) hypertension: Secondary | ICD-10-CM

## 2022-03-14 DIAGNOSIS — E785 Hyperlipidemia, unspecified: Secondary | ICD-10-CM

## 2022-03-14 DIAGNOSIS — E119 Type 2 diabetes mellitus without complications: Secondary | ICD-10-CM

## 2022-03-14 LAB — POCT UA - MICROALBUMIN
Albumin/Creatinine Ratio, Urine, POC: <30
Creatinine, POC: 50 mg/dL
Microalbumin Ur, POC: 10 mg/L

## 2022-03-14 LAB — POCT GLYCOSYLATED HEMOGLOBIN (HGB A1C): Hemoglobin A1C: 7.4 % — AB (ref 4.0–5.6)

## 2022-03-14 MED ORDER — METFORMIN HCL 500 MG PO TABS: 500.0000 mg | ORAL_TABLET | Freq: Two times a day (BID) | ORAL | 3 refills | Status: DC

## 2022-03-14 MED ORDER — ROSUVASTATIN CALCIUM 10 MG PO TABS: 10.0000 mg | ORAL_TABLET | Freq: Every day | ORAL | 3 refills | Status: DC

## 2022-03-14 MED ORDER — OLMESARTAN MEDOXOMIL-HCTZ 40-25 MG PO TABS: 1.0000 | ORAL_TABLET | Freq: Every day | ORAL | 0 refills | Status: DC

## 2022-03-14 MED ORDER — AMLODIPINE BESYLATE 10 MG PO TABS: 10.0000 mg | ORAL_TABLET | Freq: Every day | ORAL | 3 refills | Status: DC

## 2022-03-14 MED ORDER — LEVOTHYROXINE SODIUM 100 MCG PO TABS: 100.0000 ug | ORAL_TABLET | Freq: Every day | ORAL | 3 refills | Status: DC

## 2022-03-14 MED ORDER — VITAMIN D (ERGOCALCIFEROL) 1.25 MG (50000 UNIT) PO CAPS: 50000.0000 [IU] | ORAL_CAPSULE | ORAL | 3 refills | Status: DC

## 2022-03-14 MED ORDER — CLONIDINE HCL 0.1 MG PO TABS: 0.1000 mg | ORAL_TABLET | Freq: Two times a day (BID) | ORAL | 3 refills | Status: DC

## 2022-03-14 MED ORDER — CARVEDILOL 25 MG PO TABS: 25.0000 mg | ORAL_TABLET | Freq: Two times a day (BID) | ORAL | 0 refills | Status: DC

## 2022-03-14 NOTE — Progress Notes (Signed)
03/14/2022  Endocrinology follow-up note   Subjective:    Patient ID: Jacob Lowe, male    DOB: 1945/07/29,   Past Medical History:  Diagnosis Date   Diabetes mellitus    Hypertension    Hypertension    Hyperthyroidism    Resolved  after therapy with [id number]   Hypothyroidism    Past Surgical History:  Procedure Laterality Date   COLONOSCOPY N/A 06/11/2017   Procedure: COLONOSCOPY;  Surgeon: Rogene Houston, MD;  Location: [facility];  Service: Endoscopy;  Laterality: N/A;  830   Social History   Socioeconomic History   Marital status: Married    Spouse name: Not on file   Number of children: Not on file   Years of education: Not on file   Highest education level: Not on file  Occupational History   Not on file  Tobacco Use   Smoking status: Former   Smokeless tobacco: Never  Vaping Use   Vaping Use: Never used  Substance and Sexual Activity   Alcohol use: Yes    Alcohol/week: 0.0 standard drinks of alcohol    Comment: one to two beer per day   Drug use: No   Sexual activity: Not on file  Other Topics Concern   Not on file  Social History Narrative   Not on file   Social Determinants of Health   Financial Resource Strain: Not on file  Food Insecurity: Not on file  Transportation Needs: Not on file  Physical Activity: Not on file  Stress: Not on file  Social Connections: Not on file   Outpatient Encounter Medications as of 03/14/2022  Medication Sig   Accu-Chek Softclix Lancets lancets Use as instructed to monitor glucose once daily.   amLODipine (NORVASC) 10 MG tablet Take 1 tablet (10 mg total) by mouth daily.   carvedilol (COREG) 25 MG tablet Take 1 tablet (25 mg total) by mouth 2 (two) times daily.   cloNIDine (CATAPRES) 0.1 MG tablet Take 1 tablet (0.1 mg total) by mouth 2 (two) times daily.   glucose blood (ACCU-CHEK GUIDE) test strip Use as instructed to monitor glucose once daily   levothyroxine (SYNTHROID) 100 MCG tablet Take 1 tablet (100 mcg  total) by mouth daily before breakfast.   metFORMIN (GLUCOPHAGE) 500 MG tablet Take 1 tablet (500 mg total) by mouth 2 (two) times daily with a meal.   olmesartan-hydrochlorothiazide (BENICAR HCT) 40-25 MG tablet Take 1 tablet by mouth daily.   rosuvastatin (CRESTOR) 10 MG tablet Take 1 tablet (10 mg total) by mouth daily.   Vitamin D, Ergocalciferol, (DRISDOL) 1.25 MG (50000 UNIT) CAPS capsule Take 1 capsule (50,000 Units total) by mouth every 7 (seven) days.   [DISCONTINUED] amLODipine (NORVASC) 10 MG tablet Take 10 mg by mouth daily.   [DISCONTINUED] carvedilol (COREG) 25 MG tablet TAKE 1 TABLET BY MOUTH TWICE A DAY   [DISCONTINUED] cloNIDine (CATAPRES) 0.1 MG tablet TAKE 1 TABLET BY MOUTH TWICE A DAY   [DISCONTINUED] levothyroxine (SYNTHROID) 100 MCG tablet TAKE 1 TABLET BY MOUTH EVERY DAY BEFORE BREAKFAST   [DISCONTINUED] metFORMIN (GLUCOPHAGE) 500 MG tablet TAKE 1 TABLET BY MOUTH TWICE A DAY WITH FOOD   [DISCONTINUED] olmesartan-hydrochlorothiazide (BENICAR HCT) 40-25 MG tablet TAKE 1 TABLET BY MOUTH EVERY DAY   [DISCONTINUED] rosuvastatin (CRESTOR) 10 MG tablet TAKE 1 TABLET BY MOUTH EVERY DAY   [DISCONTINUED] Vitamin D, Ergocalciferol, (DRISDOL) 1.25 MG (50000 UNIT) CAPS capsule TAKE 1 CAPSULE (50,000 UNITS TOTAL) BY MOUTH EVERY 7 (SEVEN) DAYS  No facility-administered encounter medications on file as of 03/14/2022.   ALLERGIES: No Known Allergies VACCINATION STATUS: Immunization History  Administered Date(s) Administered   Moderna Sars-Covid-2 Vaccination 02/20/2019, 03/20/2019, 10/29/2019, 07/20/2020    Diabetes He presents for his follow-up diabetic visit. He has type 2 diabetes mellitus. His disease course has been improving. There are no hypoglycemic associated symptoms. Pertinent negatives for hypoglycemia include no nervousness/anxiousness or tremors. Associated symptoms include weight loss. Pertinent negatives for diabetes include no blurred vision, no chest pain, no fatigue,  no polydipsia, no polyphagia, no polyuria and no weakness. There are no hypoglycemic complications. Symptoms are stable. Diabetic complications include nephropathy. Risk factors for coronary artery disease include diabetes mellitus, dyslipidemia, hypertension, male sex and sedentary lifestyle. Current diabetic treatment includes oral agent (monotherapy). He is compliant with treatment most of the time. His weight is fluctuating minimally. He is following a generally healthy diet. When asked about meal planning, he reported none. He has not had a previous visit with a dietitian. He rarely participates in exercise. (He presents today with no meter or logs to review.  He does not monitor glucose regularly due to being on Metformin only.  His POCT A1c today is 7.4%, improving from last visit of 8%.  He has been working hard on his diet and exercise regimen.  He does note he went without working out for a brief period of time due to back injury.) An ACE inhibitor/angiotensin II receptor blocker is being taken. He does not see a podiatrist.Eye exam is current.  Thyroid Problem Presents for follow-up (77 yr old patient with Graves' disease status post radioactive iodine ablation of the thyroid on June 15, 2011.  ) visit. Symptoms include weight loss. Patient reports no anxiety, cold intolerance, constipation, depressed mood, diarrhea, fatigue, heat intolerance, leg swelling, palpitations, tremors or weight gain. The symptoms have been stable.  Hypertension This is a chronic problem. The current episode started more than 1 year ago. The problem is unchanged. The problem is uncontrolled. Pertinent negatives include no blurred vision, chest pain or palpitations. Agents associated with hypertension include thyroid hormones. Risk factors for coronary artery disease include diabetes mellitus, dyslipidemia, male gender and sedentary lifestyle. Past treatments include diuretics, central alpha agonists, angiotensin blockers and  beta blockers. The current treatment provides mild improvement. There are no compliance problems.  Hypertensive end-organ damage includes kidney disease. Identifiable causes of hypertension include chronic renal disease and a thyroid problem.    Review of systems  Constitutional: + steadily decreasing body weight,  current Body mass index is 28.21 kg/m. , no fatigue, no subjective hyperthermia, no subjective hypothermia Eyes: no blurry vision, no xerophthalmia ENT: no sore throat, no nodules palpated in throat, no dysphagia/odynophagia, no hoarseness Cardiovascular: no chest pain, no shortness of breath, no palpitations, no leg swelling Respiratory: no cough, no shortness of breath Gastrointestinal: no nausea/vomiting/diarrhea Musculoskeletal: no muscle/joint aches Skin: no rashes, no hyperemia Neurological: no tremors, no numbness, no tingling, no dizziness Psychiatric: no depression, no anxiety   Objective:    BP (!) 144/86   Pulse (!) 59   Ht 6' (1.829 m)   Wt 208 lb (94.3 kg)   BMI 28.21 kg/m   Wt Readings from Last 3 Encounters:  03/14/22 208 lb (94.3 kg)  09/13/21 217 lb 6.4 oz (98.6 kg)  03/12/21 217 lb 12.8 oz (98.8 kg)    BP Readings from Last 3 Encounters:  03/14/22 (!) 144/86  09/13/21 (!) 161/75  03/12/21 (!) 182/68    Physical  Exam- Limited  Constitutional:  Body mass index is 28.21 kg/m. , not in acute distress, normal state of mind Eyes:  EOMI, no exophthalmos Musculoskeletal: no gross deformities, strength intact in all four extremities, no gross restriction of joint movements Skin:  no rashes, no hyperemia Neurological: no tremor with outstretched hands   Diabetic Foot Exam - Simple   Simple Foot Form Diabetic Foot exam was performed with the following findings: Yes 03/14/2022  2:01 PM  Visual Inspection No deformities, no ulcerations, no other skin breakdown bilaterally: Yes Sensation Testing Intact to touch and monofilament testing bilaterally:  Yes Pulse Check Posterior Tibialis and Dorsalis pulse intact bilaterally: Yes Comments     Recent Results (from the past 2160 hour(s))  Comprehensive metabolic panel     Status: Abnormal   Collection Time: 03/08/22  8:01 AM  Result Value Ref Range   Glucose 125 (H) 70 - 99 mg/dL   BUN 31 (H) 8 - 27 mg/dL   Creatinine, Ser 1.40 (H) 0.76 - 1.27 mg/dL   eGFR 52 (L) >59 mL/min/1.73   BUN/Creatinine Ratio 22 10 - 24   Sodium 140 134 - 144 mmol/L   Potassium 4.8 3.5 - 5.2 mmol/L   Chloride 102 96 - 106 mmol/L   CO2 18 (L) 20 - 29 mmol/L   Calcium 10.2 8.6 - 10.2 mg/dL   Total Protein 7.8 6.0 - 8.5 g/dL   Albumin 4.9 (H) 3.8 - 4.8 g/dL   Globulin, Total 2.9 1.5 - 4.5 g/dL   Albumin/Globulin Ratio 1.7 1.2 - 2.2   Bilirubin Total 0.4 0.0 - 1.2 mg/dL   Alkaline Phosphatase 61 44 - 121 IU/L   AST 14 0 - 40 IU/L   ALT 11 0 - 44 IU/L  Lipid panel     Status: Abnormal   Collection Time: 03/08/22  8:01 AM  Result Value Ref Range   Cholesterol, Total 100 100 - 199 mg/dL   Triglycerides 127 0 - 149 mg/dL   HDL 31 (L) >39 mg/dL   VLDL Cholesterol Cal 23 5 - 40 mg/dL   LDL Chol Calc (NIH) 46 0 - 99 mg/dL   Chol/HDL Ratio 3.2 0.0 - 5.0 ratio    Comment:                                   T. Chol/HDL Ratio                                             Men  Women                               1/2 Avg.Risk  3.4    3.3                                   Avg.Risk  5.0    4.4                                2X Avg.Risk  9.6    7.1  3X Avg.Risk 23.4   11.0   TSH     Status: None   Collection Time: 03/08/22  8:01 AM  Result Value Ref Range   TSH 0.779 0.450 - 4.500 uIU/mL  T4, free     Status: None   Collection Time: 03/08/22  8:01 AM  Result Value Ref Range   Free T4 1.72 0.82 - 1.77 ng/dL  VITAMIN D 25 Hydroxy (Vit-D Deficiency, Fractures)     Status: Abnormal   Collection Time: 03/08/22  8:01 AM  Result Value Ref Range   Vit D, 25-Hydroxy 20.8 (L) 30.0 - 100.0  ng/mL    Comment: Vitamin D deficiency has been defined by the [place] and an Endocrine Society practice guideline as a level of serum 25-OH vitamin D less than 20 ng/mL (1,2). The Endocrine Society went on to further define vitamin D insufficiency as a level between 21 and 29 ng/mL (2). 1. IOM (Institute of Medicine). 2010. Dietary reference    intakes for calcium and D. [place]: The    [employer]. 2. Holick MF, [place], Bischoff-Ferrari HA, et al.    Evaluation, treatment, and prevention of vitamin D    deficiency: an Endocrine Society clinical practice    guideline. JCEM. 2011 Jul; 96(7):1911-30.   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  He is advised to continue Levothyroxine 100 mcg po daily before breakfast.  Will recheck TFTs prior to next visit and adjust dose if needed.   - We discussed about the correct intake of his thyroid hormone, on empty stomach at fasting, with water, separated by at least 30 minutes from breakfast and other medications,  and separated by more than 4 hours from calcium, iron, multivitamins, acid reflux medications (PPIs). -Patient is made aware of the fact that thyroid hormone replacement is needed for life, dose to be adjusted by periodic monitoring of thyroid function tests.  2.  Hypertension: -His blood pressure is not controlled to target again today.  His BP  is always elevated at visits, reports some white coat syndrome.  He reports BP is better when he monitors it at home.  He is advised to continue Coreg 25 mg po daily, Clonidine 0.1 mg po BID, and Benicar- HCT 40-25 mg po daily.  3. Diabetes mellitus without complication ([place])  He presents today with no meter or logs to review.  He does not monitor glucose regularly due to being on Metformin only.  His POCT A1c today is 7.4%, improving from last visit of 8%.  He has been working hard on his diet and exercise regimen.  He does note he went without working out for a brief period of time due to back injury.  - Nutritional counseling repeated at each appointment due to patients  tendency to fall back in to old habits.  - The patient admits there is a room for improvement in their diet and drink choices. -  Suggestion is made for the patient to avoid simple carbohydrates from their diet including Cakes, Sweet Desserts / Pastries, Ice Cream, Soda (diet and regular), Sweet Tea, Candies, Chips, Cookies, Sweet Pastries, Store Bought Juices, Alcohol in Excess of 1-2 drinks a day, Artificial Sweeteners, Coffee Creamer, and "Sugar-free" Products. This will help patient to have stable blood glucose profile and potentially avoid unintended weight gain.   - I encouraged the patient to switch to unprocessed or minimally processed complex starch and increased protein intake (animal or plant source), fruits, and vegetables.   - Patient is advised to stick to a routine mealtimes to eat 3 meals a day and avoid unnecessary snacks (to snack only to correct hypoglycemia).  -No changes will be made to his medications today.  He is advised to continue Metformin 500 mg po twice daily with meals.    4. Hyperlipidemia:  His lipid panel from 03/08/22 shows controlled LDL of 46.  He is advised to continue Crestor 10 mg po daily at bedtime.  Side effects and precautions discussed with him.    5. Vitamin D deficiency: His most  recent vitamin D level from 03/08/22 was 20.8. He is advised to restart Vitamin D3 5000 units daily for supplementation.     I spent  45  minutes in the care of the patient today including review of labs from [place], Lipids, Thyroid Function, Hematology (current and previous including abstractions from other facilities); face-to-face time discussing  his blood glucose readings/logs, discussing hypoglycemia and hyperglycemia episodes and symptoms, medications doses, his options of short and long term treatment based on the latest standards of care / guidelines;  discussion about incorporating lifestyle medicine;  and documenting the encounter. Risk reduction counseling performed per USPSTF guidelines to reduce obesity and cardiovascular risk factors.     Please refer to Patient Instructions for Blood Glucose Monitoring and Insulin/Medications Dosing Guide"  in media tab for additional information. Please  also refer to " Patient Self Inventory" in the Media  tab for reviewed elements of pertinent patient history.  Jacob Lowe participated in the discussions, expressed understanding, and voiced agreement with the above plans.  All questions were answered to his satisfaction. he is encouraged to contact clinic should he have any questions or concerns prior to his return visit.    Follow up plan: Return in about 6 months (around 09/14/2022) for Diabetes F/U with A1c in office, Previsit labs, Thyroid follow up.   Rayetta Pigg, [facility] [address] [place], [place] [address] Phone: [phone] Fax: [phone]  03/14/2022, 2:13 PM

## 2022-07-02 ENCOUNTER — Other Ambulatory Visit: Payer: Self-pay | Admitting: Nurse Practitioner

## 2022-09-02 ENCOUNTER — Other Ambulatory Visit: Payer: Self-pay | Admitting: Nurse Practitioner

## 2022-09-16 ENCOUNTER — Encounter: Payer: Self-pay | Admitting: Nurse Practitioner

## 2022-09-16 ENCOUNTER — Ambulatory Visit (INDEPENDENT_AMBULATORY_CARE_PROVIDER_SITE_OTHER): Payer: BC Managed Care – PPO | Admitting: Nurse Practitioner

## 2022-09-16 VITALS — BP 140/80 | HR 58 | Ht 72.0 in | Wt 215.8 lb

## 2022-09-16 DIAGNOSIS — I10 Essential (primary) hypertension: Secondary | ICD-10-CM

## 2022-09-16 DIAGNOSIS — E559 Vitamin D deficiency, unspecified: Secondary | ICD-10-CM

## 2022-09-16 DIAGNOSIS — E119 Type 2 diabetes mellitus without complications: Secondary | ICD-10-CM | POA: Diagnosis not present

## 2022-09-16 DIAGNOSIS — Z7984 Long term (current) use of oral hypoglycemic drugs: Secondary | ICD-10-CM | POA: Diagnosis not present

## 2022-09-16 DIAGNOSIS — E890 Postprocedural hypothyroidism: Secondary | ICD-10-CM | POA: Diagnosis not present

## 2022-09-16 DIAGNOSIS — E785 Hyperlipidemia, unspecified: Secondary | ICD-10-CM | POA: Diagnosis not present

## 2022-09-16 LAB — POCT GLYCOSYLATED HEMOGLOBIN (HGB A1C): Hemoglobin A1C: 8.5 % — AB (ref 4.0–5.6)

## 2022-09-16 NOTE — Progress Notes (Signed)
09/16/2022  Endocrinology follow-up note   Subjective:    Patient ID: Jacob Lowe, male    DOB: 04-Oct-1945,   Past Medical History:  Diagnosis Date   Diabetes mellitus    Hypertension    Hypertension    Hyperthyroidism    Resolved  after therapy with I-131   Hypothyroidism    Past Surgical History:  Procedure Laterality Date   COLONOSCOPY N/A 06/11/2017   Procedure: COLONOSCOPY;  Surgeon: Malissa Hippo, MD;  Location: [facility];  Service: Endoscopy;  Laterality: N/A;  830   Social History   Socioeconomic History   Marital status: Married    Spouse name: Not on file   Number of children: Not on file   Years of education: Not on file   Highest education level: Not on file  Occupational History   Not on file  Tobacco Use   Smoking status: Former   Smokeless tobacco: Never  Vaping Use   Vaping status: Never Used  Substance and Sexual Activity   Alcohol use: Yes    Alcohol/week: 0.0 standard drinks of alcohol    Comment: one to two beer per day   Drug use: No   Sexual activity: Not on file  Other Topics Concern   Not on file  Social History Narrative   Not on file   Social Determinants of Health   Financial Resource Strain: Not on file  Food Insecurity: Not on file  Transportation Needs: Not on file  Physical Activity: Not on file  Stress: Not on file  Social Connections: Not on file   Outpatient Encounter Medications as of 09/16/2022  Medication Sig   Accu-Chek Softclix Lancets lancets Use as instructed to monitor glucose once daily.   amLODipine (NORVASC) 10 MG tablet Take 1 tablet (10 mg total) by mouth daily.   carvedilol (COREG) 25 MG tablet TAKE 1 TABLET BY MOUTH TWICE A DAY   cloNIDine (CATAPRES) 0.1 MG tablet Take 1 tablet (0.1 mg total) by mouth 2 (two) times daily.   glucose blood (ACCU-CHEK GUIDE) test strip Use as instructed to monitor glucose once daily   levothyroxine (SYNTHROID) 100 MCG tablet Take 1 tablet (100 mcg total) by mouth daily  before breakfast.   metFORMIN (GLUCOPHAGE) 500 MG tablet Take 1 tablet (500 mg total) by mouth 2 (two) times daily with a meal.   olmesartan-hydrochlorothiazide (BENICAR HCT) 40-25 MG tablet TAKE 1 TABLET BY MOUTH EVERY DAY   rosuvastatin (CRESTOR) 10 MG tablet Take 1 tablet (10 mg total) by mouth daily.   Vitamin D, Ergocalciferol, (DRISDOL) 1.25 MG (50000 UNIT) CAPS capsule Take 1 capsule (50,000 Units total) by mouth every 7 (seven) days.   No facility-administered encounter medications on file as of 09/16/2022.   ALLERGIES: No Known Allergies VACCINATION STATUS: Immunization History  Administered Date(s) Administered   Moderna Sars-Covid-2 Vaccination 02/20/2019, 03/20/2019, 10/29/2019, 07/20/2020    Diabetes He presents for his follow-up diabetic visit. He has type 2 diabetes mellitus. His disease course has been improving. There are no hypoglycemic associated symptoms. Pertinent negatives for hypoglycemia include no nervousness/anxiousness or tremors. Associated symptoms include weight loss. Pertinent negatives for diabetes include no blurred vision, no chest pain, no fatigue, no polydipsia, no polyphagia, no polyuria and no weakness. There are no hypoglycemic complications. Symptoms are stable. Diabetic complications include nephropathy. Risk factors for coronary artery disease include diabetes mellitus, dyslipidemia, hypertension, male sex and sedentary lifestyle. Current diabetic treatment includes oral agent (monotherapy). He is compliant with treatment most  of the time. His weight is fluctuating minimally. He is following a generally healthy diet. When asked about meal planning, he reported none. He has not had a previous visit with a dietitian. He rarely participates in exercise. (He presents today with no meter or logs to review.  He does not monitor glucose regularly due to being on Metformin only.  His POCT A1c today is 7.4%, improving from last visit of 8%.  He has been working hard on  his diet and exercise regimen.  He does note he went without working out for a brief period of time due to back injury.) An ACE inhibitor/angiotensin II receptor blocker is being taken. He does not see a podiatrist.Eye exam is current.  Thyroid Problem Presents for follow-up (77 yr old patient with Graves' disease status post radioactive iodine ablation of the thyroid on June 15, 2011.  ) visit. Symptoms include weight loss. Patient reports no anxiety, cold intolerance, constipation, depressed mood, diarrhea, fatigue, heat intolerance, leg swelling, palpitations, tremors or weight gain. The symptoms have been stable.  Hypertension This is a chronic problem. The current episode started more than 1 year ago. The problem is unchanged. The problem is uncontrolled. Pertinent negatives include no blurred vision, chest pain or palpitations. Agents associated with hypertension include thyroid hormones. Risk factors for coronary artery disease include diabetes mellitus, dyslipidemia, male gender and sedentary lifestyle. Past treatments include diuretics, central alpha agonists, angiotensin blockers and beta blockers. The current treatment provides mild improvement. There are no compliance problems.  Hypertensive end-organ damage includes kidney disease. Identifiable causes of hypertension include chronic renal disease and a thyroid problem.    Review of systems  Constitutional: + fluctuating body weight,  current Body mass index is 29.27 kg/m. , no fatigue, no subjective hyperthermia, no subjective hypothermia Eyes: no blurry vision, no xerophthalmia ENT: no sore throat, no nodules palpated in throat, no dysphagia/odynophagia, no hoarseness Cardiovascular: no chest pain, no shortness of breath, no palpitations, no leg swelling Respiratory: no cough, no shortness of breath Gastrointestinal: no nausea/vomiting/diarrhea Musculoskeletal: no muscle/joint aches Skin: no rashes, no hyperemia Neurological: no  tremors, no numbness, no tingling, no dizziness Psychiatric: no depression, no anxiety   Objective:    BP (!) 140/80 (BP Location: Left Arm, Patient Position: Sitting, Cuff Size: Large) Comment: Retake Manuel  Pulse (!) 58   Ht 6' (1.829 m)   Wt 215 lb 12.8 oz (97.9 kg)   BMI 29.27 kg/m   Wt Readings from Last 3 Encounters:  09/16/22 215 lb 12.8 oz (97.9 kg)  03/14/22 208 lb (94.3 kg)  09/13/21 217 lb 6.4 oz (98.6 kg)    BP Readings from Last 3 Encounters:  09/16/22 (!) 140/80  03/14/22 (!) 144/86  09/13/21 (!) 161/75    Physical Exam- Limited  Constitutional:  Body mass index is 29.27 kg/m. , not in acute distress, normal state of mind Eyes:  EOMI, no exophthalmos Musculoskeletal: no gross deformities, strength intact in all four extremities, no gross restriction of joint movements Skin:  no rashes, no hyperemia Neurological: no tremor with outstretched hands   Diabetic Foot Exam - Simple   No data filed     Recent Results (from the past 2160 hour(s))  T4, free     Status: None   Collection Time: 09/10/22  7:40 AM  Result Value Ref Range   Free T4 1.3 0.8 - 1.8 ng/dL  TSH     Status: None   Collection Time: 09/10/22  7:40 AM  Result  Value Ref Range   TSH 1.04 0.40 - 4.50 mIU/L  Comprehensive metabolic panel     Status: Abnormal   Collection Time: 09/10/22  7:40 AM  Result Value Ref Range   Glucose, Bld 140 (H) 65 - 99 mg/dL    Comment: .            Fasting reference interval . For someone without known diabetes, a glucose value >125 mg/dL indicates that they may have diabetes and this should be confirmed with a follow-up test. .    BUN 24 7 - 25 mg/dL   Creat 9.62 9.52 - 8.41 mg/dL   BUN/Creatinine Ratio SEE NOTE: 6 - 22 (calc)    Comment:    Not Reported: BUN and Creatinine are within    reference range. .    Sodium 140 135 - 146 mmol/L   Potassium 4.6 3.5 - 5.3 mmol/L   Chloride 106 98 - 110 mmol/L   CO2 23 20 - 32 mmol/L   Calcium 10.4 (H)  8.6 - 10.3 mg/dL   Total Protein 8.0 6.1 - 8.1 g/dL   Albumin 4.7 3.6 - 5.1 g/dL   Globulin 3.3 1.9 - 3.7 g/dL (calc)   AG Ratio 1.4 1.0 - 2.5 (calc)   Total Bilirubin 0.5 0.2 - 1.2 mg/dL   Alkaline phosphatase (APISO) 59 35 - 144 U/L   AST 16 10 - 35 U/L   ALT 14 9 - 46 U/L  HgB A1c     Status: Abnormal   Collection Time: 09/16/22  1:54 PM  Result Value Ref Range   Hemoglobin A1C 8.5 (A) 4.0 - 5.6 %   HbA1c POC (<> result, manual entry)     HbA1c, POC (prediabetic range)     HbA1c, POC (controlled diabetic range)      Latest Reference Range & Units 10/27/19 10:56 03/03/20 07:23 09/04/20 07:12 03/05/21 08:16 03/08/22 08:01 09/10/22 07:40  TSH 0.40 - 4.50 mIU/L 0.48 1.32 1.07 0.76 0.779 1.04  T4,Free(Direct) 0.8 - 1.8 ng/dL 1.6 1.6 1.4 1.7 3.24 1.3    Assessment & Plan:   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  He is advised to continue Levothyroxine 100 mcg po daily before breakfast.    - We discussed about the correct intake of his thyroid hormone, on empty stomach at fasting, with water, separated by at least 30 minutes from breakfast and other medications,  and separated by more than 4 hours from calcium, iron, multivitamins, acid reflux medications (PPIs). -Patient is made aware of the fact that thyroid hormone replacement is needed for life, dose to be adjusted by periodic monitoring of thyroid function tests.  2.  Hypertension: -His blood pressure is not controlled to target again today.  His BP is always elevated at visits, reports some white coat syndrome.  He reports BP is better when he monitors it at home.  He is advised to continue Coreg 25 mg po daily, Clonidine 0.1 mg po BID, and Benicar- HCT 40-25 mg po daily.  3. Diabetes mellitus without complication (HCC)  He presents today with no meter or logs to review.  He does not monitor glucose regularly due to being on Metformin only.  His POCT A1c today is  8.5%, increasing from last visit of 7.4%.  He has been working hard on his diet and exercise regimen.  He has not been sick nor had any steroid treatment since last visit.  - Nutritional counseling  repeated at each appointment due to patients tendency to fall back in to old habits.  - The patient admits there is a room for improvement in their diet and drink choices. -  Suggestion is made for the patient to avoid simple carbohydrates from their diet including Cakes, Sweet Desserts / Pastries, Ice Cream, Soda (diet and regular), Sweet Tea, Candies, Chips, Cookies, Sweet Pastries, Store Bought Juices, Alcohol in Excess of 1-2 drinks a day, Artificial Sweeteners, Coffee Creamer, and "Sugar-free" Products. This will help patient to have stable blood glucose profile and potentially avoid unintended weight gain.   - I encouraged the patient to switch to unprocessed or minimally processed complex starch and increased protein intake (animal or plant source), fruits, and vegetables.   - Patient is advised to stick to a routine mealtimes to eat 3 meals a day and avoid unnecessary snacks (to snack only to correct hypoglycemia).  -No changes will be made to his medications today.  He is advised to continue Metformin 500 mg po twice daily with meals.    4. Hyperlipidemia:  His lipid panel from 03/08/22 shows controlled LDL of 46.  He is advised to continue Crestor 10 mg po daily at bedtime.  Side effects and precautions discussed with him.    5. Vitamin D deficiency: His most recent vitamin D level from 03/08/22 was 20.8. He is advised to restart Vitamin D3 5000 units daily for supplementation.     I spent  42  minutes in the care of the patient today including review of labs from CMP, Lipids, Thyroid Function, Hematology (current and previous including abstractions from other facilities); face-to-face time discussing  his blood glucose readings/logs, discussing hypoglycemia and hyperglycemia episodes and  symptoms, medications doses, his options of short and long term treatment based on the latest standards of care / guidelines;  discussion about incorporating lifestyle medicine;  and documenting the encounter. Risk reduction counseling performed per USPSTF guidelines to reduce obesity and cardiovascular risk factors.     Please refer to Patient Instructions for Blood Glucose Monitoring and Insulin/Medications Dosing Guide"  in media tab for additional information. Please  also refer to " Patient Self Inventory" in the Media  tab for reviewed elements of pertinent patient history.  Jacob Lowe participated in the discussions, expressed understanding, and voiced agreement with the above plans.  All questions were answered to his satisfaction. he is encouraged to contact clinic should he have any questions or concerns prior to his return visit.    Follow up plan: Return in about 6 months (around 03/16/2023) for Diabetes F/U with A1c in office, Thyroid follow up, Previsit labs.   Ronny Bacon, [facility] [address] [place], [place] [address] Phone: [phone] Fax: [phone]  09/16/2022, 2:08 PM

## 2022-09-25 ENCOUNTER — Other Ambulatory Visit: Payer: Self-pay | Admitting: Nurse Practitioner

## 2022-09-27 ENCOUNTER — Other Ambulatory Visit: Payer: Self-pay | Admitting: Nurse Practitioner

## 2022-12-21 ENCOUNTER — Other Ambulatory Visit: Payer: Self-pay | Admitting: Nurse Practitioner

## 2022-12-24 ENCOUNTER — Other Ambulatory Visit: Payer: Self-pay | Admitting: Nurse Practitioner

## 2022-12-25 ENCOUNTER — Other Ambulatory Visit: Payer: Self-pay | Admitting: Nurse Practitioner

## 2022-12-25 DIAGNOSIS — E559 Vitamin D deficiency, unspecified: Secondary | ICD-10-CM

## 2023-02-19 LAB — LAB REPORT - SCANNED
A1c: 8
Creatinine, POC: 207 mg/dL
EGFR: 52
Free T4: 1.58 ng/dL
Microalbumin, Urine: 0.4
TSH: 0.76 (ref 0.41–5.90)

## 2023-02-26 ENCOUNTER — Encounter: Payer: Self-pay | Admitting: Nurse Practitioner

## 2023-02-27 ENCOUNTER — Other Ambulatory Visit: Payer: Self-pay | Admitting: Nurse Practitioner

## 2023-03-05 ENCOUNTER — Other Ambulatory Visit: Payer: Self-pay | Admitting: Nurse Practitioner

## 2023-03-17 ENCOUNTER — Encounter: Payer: Self-pay | Admitting: Nurse Practitioner

## 2023-03-17 ENCOUNTER — Ambulatory Visit: Payer: BC Managed Care – PPO | Admitting: Nurse Practitioner

## 2023-03-17 VITALS — BP 140/68 | HR 65 | Ht 72.0 in | Wt 216.2 lb

## 2023-03-17 DIAGNOSIS — Z7984 Long term (current) use of oral hypoglycemic drugs: Secondary | ICD-10-CM

## 2023-03-17 DIAGNOSIS — E785 Hyperlipidemia, unspecified: Secondary | ICD-10-CM

## 2023-03-17 DIAGNOSIS — E890 Postprocedural hypothyroidism: Secondary | ICD-10-CM | POA: Diagnosis not present

## 2023-03-17 DIAGNOSIS — E559 Vitamin D deficiency, unspecified: Secondary | ICD-10-CM

## 2023-03-17 DIAGNOSIS — I10 Essential (primary) hypertension: Secondary | ICD-10-CM | POA: Diagnosis not present

## 2023-03-17 DIAGNOSIS — E119 Type 2 diabetes mellitus without complications: Secondary | ICD-10-CM | POA: Diagnosis not present

## 2023-03-17 NOTE — Progress Notes (Signed)
 03/17/2023  Endocrinology follow-up note   Subjective:    Patient ID: Jacob Lowe, male    DOB: August 06, 1945,   Past Medical History:  Diagnosis Date   Diabetes mellitus    Hypertension    Hypertension    Hyperthyroidism    Resolved  after therapy with I-131   Hypothyroidism    Past Surgical History:  Procedure Laterality Date   COLONOSCOPY N/A 06/11/2017   Procedure: COLONOSCOPY;  Surgeon: Malissa Hippo, MD;  Location: [facility];  Service: Endoscopy;  Laterality: N/A;  830   Social History   Socioeconomic History   Marital status: Married    Spouse name: Not on file   Number of children: Not on file   Years of education: Not on file   Highest education level: Not on file  Occupational History   Not on file  Tobacco Use   Smoking status: Former   Smokeless tobacco: Never  Vaping Use   Vaping status: Never Used  Substance and Sexual Activity   Alcohol use: Yes    Alcohol/week: 0.0 standard drinks of alcohol    Comment: one to two beer per day   Drug use: No   Sexual activity: Not on file  Other Topics Concern   Not on file  Social History Narrative   Not on file   Social Drivers of Health   Financial Resource Strain: Not on file  Food Insecurity: Not on file  Transportation Needs: Not on file  Physical Activity: Not on file  Stress: Not on file  Social Connections: Not on file   Outpatient Encounter Medications as of 03/17/2023  Medication Sig   Accu-Chek Softclix Lancets lancets Use as instructed to monitor glucose once daily.   amLODipine (NORVASC) 10 MG tablet TAKE 1 TABLET BY MOUTH EVERY DAY   carvedilol (COREG) 25 MG tablet TAKE 1 TABLET BY MOUTH TWICE A DAY   cloNIDine (CATAPRES) 0.1 MG tablet TAKE 1 TABLET BY MOUTH 2 TIMES DAILY.   glucose blood (ACCU-CHEK GUIDE) test strip Use as instructed to monitor glucose once daily   levothyroxine (SYNTHROID) 100 MCG tablet TAKE 1 TABLET BY MOUTH DAILY BEFORE BREAKFAST.   metFORMIN (GLUCOPHAGE) 500 MG  tablet TAKE 1 TABLET BY MOUTH 2 TIMES DAILY WITH A MEAL.   olmesartan-hydrochlorothiazide (BENICAR HCT) 40-25 MG tablet TAKE 1 TABLET BY MOUTH EVERY DAY   rosuvastatin (CRESTOR) 10 MG tablet TAKE 1 TABLET BY MOUTH EVERY DAY   Vitamin D, Ergocalciferol, (DRISDOL) 1.25 MG (50000 UNIT) CAPS capsule TAKE 1 CAPSULE (50,000 UNITS TOTAL) BY MOUTH EVERY 7 (SEVEN) DAYS   No facility-administered encounter medications on file as of 03/17/2023.   ALLERGIES: No Known Allergies VACCINATION STATUS: Immunization History  Administered Date(s) Administered   Moderna Covid-19 Vaccine Bivalent Booster 29yrs & up 12/26/2020   Moderna Sars-Covid-2 Vaccination 02/20/2019, 03/20/2019, 10/29/2019, 07/20/2020    Diabetes He presents for his follow-up diabetic visit. He has type 2 diabetes mellitus. His disease course has been improving. There are no hypoglycemic associated symptoms. Pertinent negatives for hypoglycemia include no nervousness/anxiousness or tremors. Pertinent negatives for diabetes include no blurred vision, no chest pain, no fatigue, no polydipsia, no polyphagia, no polyuria and no weakness. There are no hypoglycemic complications. Symptoms are stable. Diabetic complications include nephropathy. Risk factors for coronary artery disease include diabetes mellitus, dyslipidemia, hypertension, male sex and sedentary lifestyle. Current diabetic treatment includes oral agent (monotherapy). He is compliant with treatment most of the time. His weight is fluctuating minimally. He  is following a generally healthy diet. When asked about meal planning, he reported none. He has not had a previous visit with a dietitian. He rarely participates in exercise. (He presents today with no meter or logs to review.  He does not monitor glucose regularly due to being on Metformin only.  His previsit A1c was 8% on 2/12, improving from last visit of 8.5%.  ) An ACE inhibitor/angiotensin II receptor blocker is being taken. He does  not see a podiatrist.Eye exam is current.  Thyroid Problem Presents for follow-up (78 yr old patient with Graves' disease status post radioactive iodine ablation of the thyroid on June 15, 2011.  ) visit. Patient reports no anxiety, cold intolerance, constipation, depressed mood, diarrhea, fatigue, heat intolerance, leg swelling, palpitations, tremors or weight gain. The symptoms have been stable.  Hypertension This is a chronic problem. The current episode started more than 1 year ago. The problem is unchanged. The problem is uncontrolled. Pertinent negatives include no blurred vision, chest pain or palpitations. Agents associated with hypertension include thyroid hormones. Risk factors for coronary artery disease include diabetes mellitus, dyslipidemia, male gender and sedentary lifestyle. Past treatments include diuretics, central alpha agonists, angiotensin blockers and beta blockers. The current treatment provides mild improvement. There are no compliance problems.  Hypertensive end-organ damage includes kidney disease. Identifiable causes of hypertension include chronic renal disease and a thyroid problem.    Review of systems  Constitutional: + stable body weight,  current Body mass index is 29.32 kg/m. , no fatigue, no subjective hyperthermia, no subjective hypothermia Eyes: no blurry vision, no xerophthalmia ENT: no sore throat, no nodules palpated in throat, no dysphagia/odynophagia, no hoarseness Cardiovascular: no chest pain, no shortness of breath, no palpitations, no leg swelling Respiratory: no cough, no shortness of breath Gastrointestinal: no nausea/vomiting/diarrhea Musculoskeletal: no muscle/joint aches Skin: no rashes, no hyperemia Neurological: no tremors, no numbness, no tingling, no dizziness Psychiatric: no depression, no anxiety   Objective:    BP (!) 140/68 (BP Location: Left Arm, Patient Position: Sitting)   Pulse 65   Ht 6' (1.829 m)   Wt 216 lb 3.2 oz (98.1 kg)    BMI 29.32 kg/m   Wt Readings from Last 3 Encounters:  03/17/23 216 lb 3.2 oz (98.1 kg)  09/16/22 215 lb 12.8 oz (97.9 kg)  03/14/22 208 lb (94.3 kg)    BP Readings from Last 3 Encounters:  03/17/23 (!) 140/68  09/16/22 (!) 140/80  03/14/22 (!) 144/86    Physical Exam- Limited  Constitutional:  Body mass index is 29.32 kg/m. , not in acute distress, normal state of mind Eyes:  EOMI, no exophthalmos Musculoskeletal: no gross deformities, strength intact in all four extremities, no gross restriction of joint movements Skin:  no rashes, no hyperemia Neurological: no tremor with outstretched hands   Diabetic Foot Exam - Simple   No data filed     Recent Results (from the past 2160 hours)  Lab report - scanned     Status: None   Collection Time: 02/19/23  8:05 AM  Result Value Ref Range   A1c 8     Comment: Abs by HIM   TSH 0.76 0.41 - 5.90   Free T4 1.58 ng/dL   EGFR 81.1    Creatinine, POC 207 mg/dL   Microalbumin, Urine 0.4     Latest Reference Range & Units 03/08/22 08:01 09/10/22 07:40 02/19/23 08:05  TSH 0.41 - 5.90  0.779 1.04 0.76 (E)  T4,Free(Direct) ng/dL 9.14 1.3 7.82 (  E)  (E): External lab result   Assessment & Plan:   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  He is advised to continue Levothyroxine 100 mcg po daily before breakfast.    - We discussed about the correct intake of his thyroid hormone, on empty stomach at fasting, with water, separated by at least 30 minutes from breakfast and other medications,  and separated by more than 4 hours from calcium, iron, multivitamins, acid reflux medications (PPIs). -Patient is made aware of the fact that thyroid hormone replacement is needed for life, dose to be adjusted by periodic monitoring of thyroid function tests.  2.  Hypertension: -His blood pressure is not controlled to target again today.  His BP is always elevated at visits, reports some  white coat syndrome.  He reports BP is better when he monitors it at home.  He is advised to continue Coreg 25 mg po daily, Clonidine 0.1 mg po BID, and Benicar- HCT 40-25 mg po daily.  3. Diabetes mellitus without complication (HCC)  He presents today with no meter or logs to review.  He does not monitor glucose regularly due to being on Metformin only.  His previsit A1c was 8% on 2/12, improving from last visit of 8.5%.    - Nutritional counseling repeated at each appointment due to patients tendency to fall back in to old habits.  - The patient admits there is a room for improvement in their diet and drink choices. -  Suggestion is made for the patient to avoid simple carbohydrates from their diet including Cakes, Sweet Desserts / Pastries, Ice Cream, Soda (diet and regular), Sweet Tea, Candies, Chips, Cookies, Sweet Pastries, Store Bought Juices, Alcohol in Excess of 1-2 drinks a day, Artificial Sweeteners, Coffee Creamer, and "Sugar-free" Products. This will help patient to have stable blood glucose profile and potentially avoid unintended weight gain.   - I encouraged the patient to switch to unprocessed or minimally processed complex starch and increased protein intake (animal or plant source), fruits, and vegetables.   - Patient is advised to stick to a routine mealtimes to eat 3 meals a day and avoid unnecessary snacks (to snack only to correct hypoglycemia).  -No changes will be made to his medications today.  He is advised to continue Metformin 500 mg po twice daily with meals.    4. Hyperlipidemia:  His lipid panel from 03/08/22 shows controlled LDL of 46.  He is advised to continue Crestor 10 mg po daily at bedtime.  Side effects and precautions discussed with him.    5. Vitamin D deficiency: His most recent vitamin D level from 03/08/22 was 20.8. He is advised to restart Vitamin D3 5000 units daily for supplementation.     I spent  24  minutes in the care of the patient today  including review of labs from CMP, Lipids, Thyroid Function, Hematology (current and previous including abstractions from other facilities); face-to-face time discussing  his blood glucose readings/logs, discussing hypoglycemia and hyperglycemia episodes and symptoms, medications doses, his options of short and long term treatment based on the latest standards of care / guidelines;  discussion about incorporating lifestyle medicine;  and documenting the encounter. Risk reduction counseling performed per USPSTF guidelines to reduce obesity and cardiovascular risk factors.     Please refer to Patient Instructions for Blood Glucose Monitoring and Insulin/Medications Dosing Guide"  in media tab for additional information. Please  also refer to " Patient  Self Inventory" in the Media  tab for reviewed elements of pertinent patient history.  Jacob Lowe participated in the discussions, expressed understanding, and voiced agreement with the above plans.  All questions were answered to his satisfaction. he is encouraged to contact clinic should he have any questions or concerns prior to his return visit.    Follow up plan: Return in about 6 months (around 09/17/2023) for Diabetes F/U with A1c in office, Previsit labs.   Ronny Bacon, [facility] [address] [place], [place] [address] Phone: [phone] Fax: [phone]  03/17/2023, 1:44 PM

## 2023-03-17 NOTE — Patient Instructions (Signed)

## 2023-03-23 ENCOUNTER — Other Ambulatory Visit: Payer: Self-pay | Admitting: Nurse Practitioner

## 2023-06-15 ENCOUNTER — Other Ambulatory Visit: Payer: Self-pay | Admitting: Nurse Practitioner

## 2023-06-20 ENCOUNTER — Other Ambulatory Visit: Payer: Self-pay | Admitting: Nurse Practitioner

## 2023-09-11 LAB — COMPREHENSIVE METABOLIC PANEL WITH GFR
ALT: 14 IU/L (ref 0–44)
AST: 16 IU/L (ref 0–40)
Albumin: 4.8 g/dL (ref 3.8–4.8)
Alkaline Phosphatase: 75 IU/L (ref 44–121)
BUN/Creatinine Ratio: 17 (ref 10–24)
BUN: 19 mg/dL (ref 8–27)
Bilirubin Total: 0.4 mg/dL (ref 0.0–1.2)
CO2: 20 mmol/L (ref 20–29)
Calcium: 10.2 mg/dL (ref 8.6–10.2)
Chloride: 104 mmol/L (ref 96–106)
Creatinine, Ser: 1.15 mg/dL (ref 0.76–1.27)
Globulin, Total: 3 g/dL (ref 1.5–4.5)
Glucose: 173 mg/dL — ABNORMAL HIGH (ref 70–99)
Potassium: 4.5 mmol/L (ref 3.5–5.2)
Sodium: 141 mmol/L (ref 134–144)
Total Protein: 7.8 g/dL (ref 6.0–8.5)
eGFR: 66 mL/min/1.73 (ref >59)

## 2023-09-12 ENCOUNTER — Other Ambulatory Visit: Payer: Self-pay | Admitting: Nurse Practitioner

## 2023-09-17 ENCOUNTER — Other Ambulatory Visit: Payer: Self-pay | Admitting: Nurse Practitioner

## 2023-09-17 ENCOUNTER — Encounter: Payer: Self-pay | Admitting: Nurse Practitioner

## 2023-09-17 ENCOUNTER — Ambulatory Visit (INDEPENDENT_AMBULATORY_CARE_PROVIDER_SITE_OTHER): Admitting: Nurse Practitioner

## 2023-09-17 VITALS — BP 138/76 | HR 60 | Ht 72.0 in | Wt 213.2 lb

## 2023-09-17 DIAGNOSIS — Z7984 Long term (current) use of oral hypoglycemic drugs: Secondary | ICD-10-CM

## 2023-09-17 DIAGNOSIS — E785 Hyperlipidemia, unspecified: Secondary | ICD-10-CM

## 2023-09-17 DIAGNOSIS — E119 Type 2 diabetes mellitus without complications: Secondary | ICD-10-CM

## 2023-09-17 DIAGNOSIS — I10 Essential (primary) hypertension: Secondary | ICD-10-CM

## 2023-09-17 DIAGNOSIS — E890 Postprocedural hypothyroidism: Secondary | ICD-10-CM | POA: Diagnosis not present

## 2023-09-17 DIAGNOSIS — E559 Vitamin D deficiency, unspecified: Secondary | ICD-10-CM

## 2023-09-17 LAB — POCT GLYCOSYLATED HEMOGLOBIN (HGB A1C): Hemoglobin A1C: 8.6 % — AB (ref 4.0–5.6)

## 2023-09-17 LAB — POCT UA - MICROALBUMIN: Albumin/Creatinine Ratio, Urine, POC: <30

## 2023-09-17 NOTE — Progress Notes (Signed)
 09/17/2023  Endocrinology follow-up note   Subjective:    Patient ID: Jacob Lowe, male    DOB: 1945/08/26,   Past Medical History:  Diagnosis Date   Diabetes mellitus    Hypertension    Hypertension    Hyperthyroidism    Resolved  after therapy with [id number]   Hypothyroidism    Past Surgical History:  Procedure Laterality Date   COLONOSCOPY N/A 06/11/2017   Procedure: COLONOSCOPY;  Surgeon: Golda Claudis PENNER, MD;  Location: [facility];  Service: Endoscopy;  Laterality: N/A;  830   Social History   Socioeconomic History   Marital status: Married    Spouse name: Not on file   Number of children: Not on file   Years of education: Not on file   Highest education level: Not on file  Occupational History   Not on file  Tobacco Use   Smoking status: Former   Smokeless tobacco: Never  Vaping Use   Vaping status: Never Used  Substance and Sexual Activity   Alcohol use: Yes    Alcohol/week: 0.0 standard drinks of alcohol    Comment: one to two beer per day   Drug use: No   Sexual activity: Not on file  Other Topics Concern   Not on file  Social History Narrative   Not on file   Social Drivers of Health   Financial Resource Strain: Not on file  Food Insecurity: Not on file  Transportation Needs: Not on file  Physical Activity: Not on file  Stress: Not on file  Social Connections: Not on file   Outpatient Encounter Medications as of 09/17/2023  Medication Sig   Accu-Chek Softclix Lancets lancets Use as instructed to monitor glucose once daily.   amLODipine  (NORVASC ) 10 MG tablet TAKE 1 TABLET BY MOUTH EVERY DAY   carvedilol  (COREG ) 25 MG tablet TAKE 1 TABLET BY MOUTH TWICE A DAY   cloNIDine  (CATAPRES ) 0.1 MG tablet TAKE 1 TABLET BY MOUTH 2 TIMES DAILY.   glucose blood (ACCU-CHEK GUIDE) test strip Use as instructed to monitor glucose once daily   levothyroxine  (SYNTHROID ) 100 MCG tablet TAKE 1 TABLET BY MOUTH DAILY BEFORE BREAKFAST.   metFORMIN  (GLUCOPHAGE ) 500 MG  tablet TAKE 1 TABLET BY MOUTH 2 TIMES DAILY WITH A MEAL.   olmesartan -hydrochlorothiazide (BENICAR  HCT) 40-25 MG tablet TAKE 1 TABLET BY MOUTH EVERY DAY   rosuvastatin  (CRESTOR ) 10 MG tablet TAKE 1 TABLET BY MOUTH EVERY DAY   Vitamin D , Ergocalciferol , (DRISDOL ) 1.25 MG (50000 UNIT) CAPS capsule TAKE 1 CAPSULE (50,000 UNITS TOTAL) BY MOUTH EVERY 7 (SEVEN) DAYS   No facility-administered encounter medications on file as of 09/17/2023.   ALLERGIES: No Known Allergies VACCINATION STATUS: Immunization History  Administered Date(s) Administered   Moderna Covid-19 Vaccine Bivalent Booster 21yrs & up 12/26/2020   Moderna Sars-Covid-2 Vaccination 02/20/2019, 03/20/2019, 10/29/2019, 07/20/2020    Diabetes He presents for his follow-up diabetic visit. He has type 2 diabetes mellitus. His disease course has been fluctuating. There are no hypoglycemic associated symptoms. Pertinent negatives for hypoglycemia include no nervousness/anxiousness or tremors. Pertinent negatives for diabetes include no fatigue, no polydipsia, no polyphagia, no polyuria and no weakness. There are no hypoglycemic complications. Symptoms are stable. Diabetic complications include nephropathy. Risk factors for coronary artery disease include diabetes mellitus, dyslipidemia, hypertension, male sex and sedentary lifestyle. Current diabetic treatment includes oral agent (monotherapy). He is compliant with treatment most of the time. His weight is fluctuating minimally. He is following a generally healthy diet.  When asked about meal planning, he reported none. He has not had a previous visit with a dietitian. He rarely participates in exercise. (He presents today with no meter or logs to review.  He does not monitor glucose regularly due to being on Metformin  only.  His POCT A1c today was 8.6%, worsening from last visit of 8%.  He admits his diet has not been as good recently but promises to do better.) An ACE inhibitor/angiotensin II  receptor blocker is being taken. He does not see a podiatrist.Eye exam is current.  Thyroid  Problem Presents for follow-up (78 yr old patient with Graves' disease status post radioactive iodine ablation of the thyroid  on June 15, 2011.  ) visit. Patient reports no anxiety, cold intolerance, constipation, depressed mood, diarrhea, fatigue, heat intolerance, leg swelling, tremors or weight gain. The symptoms have been stable.    Review of systems  Constitutional: + Minimally fluctuating body weight,  current Body mass index is 28.92 kg/m. , no fatigue, no subjective hyperthermia, no subjective hypothermia Eyes: no blurry vision, no xerophthalmia ENT: no sore throat, no nodules palpated in throat, no dysphagia/odynophagia, no hoarseness Cardiovascular: no chest pain, no shortness of breath, no palpitations, no leg swelling Respiratory: no cough, no shortness of breath Gastrointestinal: no nausea/vomiting/diarrhea Musculoskeletal: no muscle/joint aches Skin: no rashes, no hyperemia Neurological: no tremors, no numbness, no tingling, no dizziness Psychiatric: no depression, no anxiety   Objective:    BP 138/76 (BP Location: Left Arm, Patient Position: Sitting, Cuff Size: Large)   Pulse 60   Ht 6' (1.829 m)   Wt 213 lb 3.2 oz (96.7 kg)   BMI 28.92 kg/m   Wt Readings from Last 3 Encounters:  09/17/23 213 lb 3.2 oz (96.7 kg)  03/17/23 216 lb 3.2 oz (98.1 kg)  09/16/22 215 lb 12.8 oz (97.9 kg)    BP Readings from Last 3 Encounters:  09/17/23 138/76  03/17/23 (!) 140/68  09/16/22 (!) 140/80     Physical Exam- Limited  Constitutional:  Body mass index is 28.92 kg/m. , not in acute distress, normal state of mind Eyes:  EOMI, no exophthalmos Musculoskeletal: no gross deformities, strength intact in all four extremities, no gross restriction of joint movements Skin:  no rashes, no hyperemia Neurological: no tremor with outstretched hands   Diabetic Foot Exam - Simple   Simple Foot  Form Diabetic Foot exam was performed with the following findings: Yes 09/17/2023  1:24 PM  Visual Inspection No deformities, no ulcerations, no other skin breakdown bilaterally: Yes Sensation Testing Intact to touch and monofilament testing bilaterally: Yes Pulse Check Posterior Tibialis and Dorsalis pulse intact bilaterally: Yes Comments     Recent Results (from the past 2160 hours)  Comprehensive metabolic panel     Status: Abnormal   Collection Time: 09/10/23  9:37 AM  Result Value Ref Range   Glucose 173 (H) 70 - 99 mg/dL   BUN 19 8 - 27 mg/dL   Creatinine, Ser 8.84 0.76 - 1.27 mg/dL   eGFR 66 >40 fO/fpw/8.26   BUN/Creatinine Ratio 17 10 - 24   Sodium 141 134 - 144 mmol/L   Potassium 4.5 3.5 - 5.2 mmol/L   Chloride 104 96 - 106 mmol/L   CO2 20 20 - 29 mmol/L   Calcium  10.2 8.6 - 10.2 mg/dL   Total Protein 7.8 6.0 - 8.5 g/dL   Albumin 4.8 3.8 - 4.8 g/dL   Globulin, Total 3.0 1.5 - 4.5 g/dL   Bilirubin Total 0.4 0.0 -  1.2 mg/dL   Alkaline Phosphatase 75 44 - 121 IU/L    Comment: **Effective September 22, 2023 Alkaline Phosphatase**   reference interval will be changing to:              Age                Male          Male           0 -  5 days         47 - 127       47 - 127           6 - 10 days         29 - 242       29 - 242          11 - 20 days        109 - 357      109 - 357          21 - 30 days         94 - 494       94 - 494           1 -  2 months      149 - 539      149 - 539           3 -  6 months      131 - 452      131 - 452           7 - 11 months      117 - 401      117 - 401   12 months -  6 years       158 - 369      158 - 369           7 - 12 years       150 - 409      150 - 409               13 years       156 - 435       78 - 227               14 years       114 - 375       64 - 161               15 years        88 - 279       56 - 134               16 years        74 - 207       51 - 121               17 years        63 - 161       47 - 113           18 - 20 years        51 - 125       42 - 106          21 - 50 years         47 - 123       41 - 116          51 -  80 years        49 - 135       51 - 125              >80 years        48 - 129       48 - 129    AST 16 0 - 40 IU/L   ALT 14 0 - 44 IU/L  POCT UA - Microalbumin     Status: Normal   Collection Time: 09/17/23  2:05 PM  Result Value Ref Range   Microalbumin Ur, POC 10mg /L mg/L   Creatinine, POC 100mg /dL mg/dL   Albumin/Creatinine Ratio, Urine, POC <30 mg/G   HgB A1c     Status: Abnormal   Collection Time: 09/17/23  2:06 PM  Result Value Ref Range   Hemoglobin A1C 8.6 (A) 4.0 - 5.6 %   HbA1c POC (<> result, manual entry)     HbA1c, POC (prediabetic range)     HbA1c, POC (controlled diabetic range)      Latest Reference Range & Units 03/08/22 08:01 09/10/22 07:40 02/19/23 08:05  TSH 0.41 - 5.90  0.779 1.04 0.76 (E)  T4,Free(Direct) ng/dL 8.27 1.3 8.41 (E)  (E): External lab result   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  He is advised to continue Levothyroxine  100 mcg po daily before breakfast.  Will recheck prior to next visit.   - We discussed about the correct intake of his thyroid  hormone, on empty stomach at fasting, with water , separated by at least 30 minutes from breakfast and other medications,  and separated by more than 4 hours from calcium , iron, multivitamins, acid reflux medications (PPIs). -Patient is made aware of the fact that thyroid  hormone replacement is needed for life, dose to be adjusted by periodic monitoring of thyroid  function tests.  2.  Hypertension: -His blood pressure is controlled to target today.   He is advised to continue meds as prescribed by PCP.  3. Diabetes mellitus without complication (HCC)  He presents today with no meter or logs to review.  He does not monitor glucose regularly due to being on Metformin  only.  His POCT A1c today was 8.6%, worsening from last visit of 8%.   He admits his diet has not been as good recently but promises to do better.   - Nutritional counseling repeated at each appointment due to patients tendency to fall back in to old habits.  - The patient admits there is a room for improvement in their diet and drink choices. -  Suggestion is made for the patient to avoid simple carbohydrates from their diet including Cakes, Sweet Desserts / Pastries, Ice Cream, Soda (diet and regular), Sweet Tea, Candies, Chips, Cookies, Sweet Pastries, Store Bought Juices, Alcohol in Excess of 1-2 drinks a day, Artificial Sweeteners, Coffee Creamer, and Sugar-free Products. This will help patient to have stable blood glucose profile and potentially avoid unintended weight gain.   - I encouraged the patient to switch to unprocessed or minimally processed complex starch and increased protein intake (animal or plant source), fruits, and vegetables.   - Patient is advised to stick to a routine mealtimes to eat 3 meals a day and avoid unnecessary snacks (to snack only to correct hypoglycemia).  -No changes will be made to his medications today.  He is advised to continue Metformin  500 mg po twice daily with meals.  We did discuss that  if he cannot regain control, we may need to add another agent to his regimen.  He promises to do better.  4. Hyperlipidemia:  His lipid panel from 03/08/22 shows controlled LDL of 46.  He is advised to continue Crestor  10 mg po daily at bedtime.  Side effects and precautions discussed with him.    5. Vitamin D  deficiency: His most recent vitamin D  level from 03/08/22 was 20.8. He is advised to restart Vitamin D3 5000 units daily for supplementation.     I spent  43  minutes in the care of the patient today including review of labs from CMP, Lipids, Thyroid  Function, Hematology (current and previous including abstractions from other facilities); face-to-face time discussing  his blood glucose readings/logs, discussing hypoglycemia and  hyperglycemia episodes and symptoms, medications doses, his options of short and long term treatment based on the latest standards of care / guidelines;  discussion about incorporating lifestyle medicine;  and documenting the encounter. Risk reduction counseling performed per USPSTF guidelines to reduce obesity and cardiovascular risk factors.     Please refer to Patient Instructions for Blood Glucose Monitoring and Insulin/Medications Dosing Guide  in media tab for additional information. Please  also refer to  Patient Self Inventory in the Media  tab for reviewed elements of pertinent patient history.  Jacob Lowe participated in the discussions, expressed understanding, and voiced agreement with the above plans.  All questions were answered to his satisfaction. he is encouraged to contact clinic should he have any questions or concerns prior to his return visit.    Follow up plan: Return in about 6 months (around 03/16/2024) for Diabetes F/U with A1c in office, Previsit labs, Thyroid  follow up.   Benton Rio, [facility] [address] [place], [place] [address] Phone: [phone] Fax: [phone]  09/17/2023, 3:01 PM

## 2023-12-10 ENCOUNTER — Other Ambulatory Visit: Payer: Self-pay | Admitting: Nurse Practitioner

## 2023-12-14 ENCOUNTER — Other Ambulatory Visit: Payer: Self-pay | Admitting: Nurse Practitioner

## 2023-12-30 ENCOUNTER — Other Ambulatory Visit: Payer: Self-pay | Admitting: Nurse Practitioner

## 2023-12-30 DIAGNOSIS — E559 Vitamin D deficiency, unspecified: Secondary | ICD-10-CM

## 2024-02-02 ENCOUNTER — Other Ambulatory Visit: Payer: Self-pay | Admitting: Nurse Practitioner

## 2024-03-16 ENCOUNTER — Ambulatory Visit: Admitting: Nurse Practitioner
